# Patient Record
Sex: Male | Born: 1975 | Race: White | Hispanic: No | Marital: Married | Smoking: Never smoker
Health system: Southern US, Community
[De-identification: ages and names within clinical notes are randomized; demographics above are authoritative.]

## PROBLEM LIST (undated history)

## (undated) DIAGNOSIS — K760 Fatty (change of) liver, not elsewhere classified: Secondary | ICD-10-CM

## (undated) DIAGNOSIS — K859 Acute pancreatitis without necrosis or infection, unspecified: Secondary | ICD-10-CM

## (undated) DIAGNOSIS — E782 Mixed hyperlipidemia: Secondary | ICD-10-CM

## (undated) DIAGNOSIS — E781 Pure hyperglyceridemia: Secondary | ICD-10-CM

## (undated) DIAGNOSIS — R7301 Impaired fasting glucose: Secondary | ICD-10-CM

## (undated) DIAGNOSIS — Z8639 Personal history of other endocrine, nutritional and metabolic disease: Secondary | ICD-10-CM

## (undated) DIAGNOSIS — F17220 Nicotine dependence, chewing tobacco, uncomplicated: Secondary | ICD-10-CM

## (undated) DIAGNOSIS — F1010 Alcohol abuse, uncomplicated: Secondary | ICD-10-CM

## (undated) HISTORY — DX: Mixed hyperlipidemia: E78.2

## (undated) HISTORY — DX: Impaired fasting glucose: R73.01

## (undated) HISTORY — DX: Fatty (change of) liver, not elsewhere classified: K76.0

## (undated) HISTORY — DX: Nicotine dependence, chewing tobacco, uncomplicated: F17.220

## (undated) HISTORY — DX: Alcohol abuse, uncomplicated: F10.10

## (undated) HISTORY — DX: Personal history of other endocrine, nutritional and metabolic disease: Z86.39

## (undated) HISTORY — DX: Pure hyperglyceridemia: E78.1

## (undated) HISTORY — DX: Acute pancreatitis without necrosis or infection, unspecified: K85.90

---

## 2014-01-23 ENCOUNTER — Ambulatory Visit (INDEPENDENT_AMBULATORY_CARE_PROVIDER_SITE_OTHER): Payer: Managed Care, Other (non HMO)

## 2014-01-23 ENCOUNTER — Ambulatory Visit (INDEPENDENT_AMBULATORY_CARE_PROVIDER_SITE_OTHER): Payer: Managed Care, Other (non HMO) | Admitting: Emergency Medicine

## 2014-01-23 VITALS — BP 106/78 | HR 80 | Temp 99.6°F | Resp 20 | Ht 67.0 in | Wt 195.2 lb

## 2014-01-23 DIAGNOSIS — S86011A Strain of right Achilles tendon, initial encounter: Secondary | ICD-10-CM

## 2014-01-23 DIAGNOSIS — M25571 Pain in right ankle and joints of right foot: Secondary | ICD-10-CM

## 2014-01-23 MED ORDER — HYDROCODONE-ACETAMINOPHEN 5-325 MG PO TABS: 1.0000 | ORAL_TABLET | ORAL | Status: DC | PRN

## 2014-01-23 NOTE — Patient Instructions (Signed)
Complete Achilles Tendon Rupture An Achilles tendon rupture is an injury in which the tough, cord-like band that attaches the lower muscles of your leg to your heel (Achilles tendon) tears (ruptures). In a complete Achilles tendon rupture, you are not able to stand up on the toes of the injured leg. CAUSES A tendon may rupture if it is weakened or weakening (degenerative) and a sudden stress is applied to it. Weakening or degeneration of a tendon may be caused by:  Recurrent injuries, such as those causing Achilles tendinitis.  Damaged tendons.  Aging.  Vascular disease of the tendon. SIGNS AND SYMPTOMS  Feeling as if you were struck violently in the back of the ankle.  Hearing a "pop" and experiencing severe, sudden (acute) pain; however, the absence of pain does not mean there was not a rupture. DIAGNOSIS A physical exam is usually all that is needed to diagnose an Achilles tendon rupture. During the exam, your health care provider will touch the tendon and the structures around it. You may be asked to lie on your stomach or kneel on a chair while your health care provider squeezes your calf muscle. You most likely have a ruptured tendon if your foot does not flex.  Sometimes tests are performed. These may include:  An ultrasound. This allows quick confirmation of the diagnosis.  An X-Darreon.  An MRI. TREATMENT  A complete Achilles tendon rupture is treated with surgery. You will have a cast while the injury heals (this usually takes 6-10 weeks). Before your surgery, treatment may consist of:  Ice applied to the area.  Pain-relieving medicines.  Rest.  Crutches.  Keeping the ankle from moving (immobilization), usually with a splint. HOME CARE INSTRUCTIONS   Apply ice to the injured area:   Put ice in a plastic bag.   Place a towel between your skin and the bag.   Leave the ice on for 20 minutes, 2-3 times a day.   Use crutches and move about only as instructed.    Keep the leg elevated above the level of the heart (the center of the chest) at all times when not using the bathroom. Do not dangle the leg over a chair, couch, or bed. When lying down, elevate your leg on a few pillows. Elevation prevents swelling and reduces pain.  Avoid use of the injured area other than gentle range of motion of the toes.  Do not drive a car until your health care provider specifically tells you it is safe to do so.  Take all medicines as directed by your health care provider.  Keep all follow-up visits as directed by your health care provider. SEEK MEDICAL CARE IF:   Your pain and swelling increase, or your pain is not controlled by medicines.   You have new, unexplained symptoms, or your symptoms get worse.  You cannot move your toes or foot.  You develop warmth and swelling in your foot.  You have an unexplained fever. MAKE SURE YOU:   Understand these instructions.  Will watch your condition.  Will get help right away if you are not doing well or get worse. Document Released: 10/31/2004 Document Revised: 06/07/2013 Document Reviewed: 09/11/2012 [facility] Patient Information 2015 [place], [place]. This information is not intended to replace advice given to you by your health care provider. Make sure you discuss any questions you have with your health care provider.
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
## 2015-03-07 ENCOUNTER — Encounter (HOSPITAL_COMMUNITY): Payer: Self-pay | Admitting: Emergency Medicine

## 2015-03-07 ENCOUNTER — Inpatient Hospital Stay (HOSPITAL_COMMUNITY)
Admission: EM | Admit: 2015-03-07 | Discharge: 2015-03-12 | DRG: 440 | Disposition: A | Discharge status: Home / Self Care | Payer: 59 | Attending: Internal Medicine | Admitting: Internal Medicine

## 2015-03-07 DIAGNOSIS — E783 Hyperchylomicronemia: Secondary | ICD-10-CM

## 2015-03-07 DIAGNOSIS — E860 Dehydration: Secondary | ICD-10-CM | POA: Diagnosis present

## 2015-03-07 DIAGNOSIS — K3580 Unspecified acute appendicitis: Secondary | ICD-10-CM

## 2015-03-07 DIAGNOSIS — D72829 Elevated white blood cell count, unspecified: Secondary | ICD-10-CM | POA: Diagnosis present

## 2015-03-07 DIAGNOSIS — R1013 Epigastric pain: Secondary | ICD-10-CM | POA: Diagnosis not present

## 2015-03-07 DIAGNOSIS — K859 Acute pancreatitis without necrosis or infection, unspecified: Secondary | ICD-10-CM | POA: Diagnosis not present

## 2015-03-07 DIAGNOSIS — R740 Nonspecific elevation of levels of transaminase and lactic acid dehydrogenase [LDH]: Secondary | ICD-10-CM | POA: Diagnosis present

## 2015-03-07 DIAGNOSIS — R748 Abnormal levels of other serum enzymes: Secondary | ICD-10-CM | POA: Diagnosis present

## 2015-03-07 DIAGNOSIS — E876 Hypokalemia: Secondary | ICD-10-CM | POA: Diagnosis not present

## 2015-03-07 DIAGNOSIS — Z23 Encounter for immunization: Secondary | ICD-10-CM

## 2015-03-07 DIAGNOSIS — E7800 Pure hypercholesterolemia, unspecified: Secondary | ICD-10-CM | POA: Diagnosis present

## 2015-03-07 DIAGNOSIS — K5900 Constipation, unspecified: Secondary | ICD-10-CM | POA: Diagnosis present

## 2015-03-07 DIAGNOSIS — Z720 Tobacco use: Secondary | ICD-10-CM

## 2015-03-07 DIAGNOSIS — K760 Fatty (change of) liver, not elsewhere classified: Secondary | ICD-10-CM | POA: Diagnosis present

## 2015-03-07 DIAGNOSIS — D696 Thrombocytopenia, unspecified: Secondary | ICD-10-CM | POA: Diagnosis not present

## 2015-03-07 DIAGNOSIS — E781 Pure hyperglyceridemia: Secondary | ICD-10-CM

## 2015-03-07 DIAGNOSIS — F1020 Alcohol dependence, uncomplicated: Secondary | ICD-10-CM | POA: Diagnosis present

## 2015-03-07 DIAGNOSIS — E875 Hyperkalemia: Secondary | ICD-10-CM

## 2015-03-07 LAB — URINALYSIS, ROUTINE W REFLEX MICROSCOPIC
Bilirubin Urine: NEGATIVE
GLUCOSE, UA: NEGATIVE mg/dL
HGB URINE DIPSTICK: NEGATIVE
KETONES UR: NEGATIVE mg/dL
LEUKOCYTES UA: NEGATIVE
Nitrite: NEGATIVE
PH: 5.5 (ref 5.0–8.0)
Protein, ur: NEGATIVE mg/dL
Specific Gravity, Urine: 1.015 (ref 1.005–1.030)

## 2015-03-07 LAB — DIFFERENTIAL
BAND NEUTROPHILS: 0 %
BASOS PCT: 0 %
BLASTS: 0 %
Basophils Absolute: 0 10*3/uL (ref 0.0–0.1)
Eosinophils Absolute: 0.2 10*3/uL (ref 0.0–0.7)
Eosinophils Relative: 1 %
LYMPHS ABS: 3.1 10*3/uL (ref 0.7–4.0)
LYMPHS PCT: 19 %
METAMYELOCYTES PCT: 0 %
MONOS PCT: 5 %
Monocytes Absolute: 0.8 10*3/uL (ref 0.1–1.0)
Myelocytes: 0 %
NEUTROS ABS: 12.2 10*3/uL — AB (ref 1.7–7.7)
NEUTROS PCT: 74 %
OTHER: 0 %
Promyelocytes Absolute: 1 %
nRBC: 0 /100 WBC

## 2015-03-07 LAB — CBC
HEMATOCRIT: 40 % (ref 39.0–52.0)
Hemoglobin: 14.5 g/dL (ref 13.0–17.0)
MCH: 31.6 pg (ref 26.0–34.0)
MCHC: 36.3 g/dL — ABNORMAL HIGH (ref 30.0–36.0)
MCV: 87.1 fL (ref 78.0–100.0)
PLATELETS: 200 10*3/uL (ref 150–400)
RBC: 4.59 MIL/uL (ref 4.22–5.81)
RDW: 13.2 % (ref 11.5–15.5)
WBC: 16.3 10*3/uL — AB (ref 4.0–10.5)

## 2015-03-07 LAB — COMPREHENSIVE METABOLIC PANEL
ALBUMIN: 4.2 g/dL (ref 3.5–5.0)
ALK PHOS: 44 U/L (ref 38–126)
ALT: 71 U/L — AB (ref 17–63)
ANION GAP: 18 — AB (ref 5–15)
AST: 68 U/L — ABNORMAL HIGH (ref 15–41)
BILIRUBIN TOTAL: 2.4 mg/dL — AB (ref 0.3–1.2)
BUN: 14 mg/dL (ref 6–20)
CO2: 18 mmol/L — AB (ref 22–32)
CREATININE: 0.93 mg/dL (ref 0.61–1.24)
Calcium: 8.9 mg/dL (ref 8.9–10.3)
Chloride: 103 mmol/L (ref 101–111)
GFR calc Af Amer: >60 mL/min (ref >60)
GFR calc non Af Amer: >60 mL/min (ref >60)
Glucose, Bld: 98 mg/dL (ref 65–99)
Potassium: 6 mmol/L — ABNORMAL HIGH (ref 3.5–5.1)
SODIUM: 139 mmol/L (ref 135–145)
Total Protein: 6.5 g/dL (ref 6.5–8.1)

## 2015-03-07 LAB — I-STAT TROPONIN, ED: Troponin i, poc: 0 ng/mL (ref 0.00–0.08)

## 2015-03-07 LAB — LIPASE, BLOOD: Lipase: 86 U/L — ABNORMAL HIGH (ref 11–51)

## 2015-03-07 MED ORDER — OXYCODONE-ACETAMINOPHEN 5-325 MG PO TABS
ORAL_TABLET | ORAL | Status: AC
  Filled 2015-03-07: qty 1

## 2015-03-07 MED ORDER — ONDANSETRON HCL 4 MG/2ML IJ SOLN
4.0000 mg | Freq: Once | INTRAMUSCULAR | Status: AC
  Administered 2015-03-07: 4 mg via INTRAVENOUS
  Filled 2015-03-07: qty 2

## 2015-03-07 MED ORDER — OXYCODONE-ACETAMINOPHEN 5-325 MG PO TABS
1.0000 | ORAL_TABLET | Freq: Once | ORAL | Status: AC
  Administered 2015-03-07: 1 via ORAL

## 2015-03-07 MED ORDER — SODIUM CHLORIDE 0.9 % IV BOLUS (SEPSIS)
1000.0000 mL | Freq: Once | INTRAVENOUS | Status: AC
  Administered 2015-03-07: 1000 mL via INTRAVENOUS

## 2015-03-07 MED ORDER — MORPHINE SULFATE (PF) 4 MG/ML IV SOLN
4.0000 mg | Freq: Once | INTRAVENOUS | Status: AC
  Administered 2015-03-07: 4 mg via INTRAVENOUS
  Filled 2015-03-07: qty 1

## 2015-03-07 NOTE — ED Notes (Signed)
EKG repeated - reports worsening epigastric pain , no nausea or SOB .

## 2015-03-07 NOTE — ED Provider Notes (Signed)
By signing my name below, I, Soijett Blue, attest that this documentation has been prepared under the direction and in the presence of [employer], DO. Electronically Signed: Soijett Blue, ED Scribe. 03/08/2015. 12:26 AM.  TIME SEEN: 11:27 PM  CHIEF COMPLAINT: Abdominal pain  HPI: HPI Comments: Alex Stevens is a 40 y.o. male who presents to the Emergency Department complaining of progressively worsening epigastric abdominal pain onset 9 AM this morning. He reports that his abdominal pain began as a dull pain that progressed to a sharp pain. He notes that his abdominal pain is worsened with breathing and movement and that it radiates to his back. Pt denies any alleviating factors. He reports that he is having normal bowel movements with his last this morning. He states that he is having associated symptoms of nausea, SOB due to pain, and chills. He states that he has not tried any medications for the relief for his symptoms. He denies vomiting, diarrhea, CP, blood in stool, black tarry stools, and any other symptoms. Denies taking excessive amounts of NSAIDs. Denies abdominal surgeries or medical issues with gallbladder or pancreas. Denies allergies to any medications. Pt states that he is an occasional alcohol drinker. States it was recently his birthday and he did drink a large amount of alcohol.   ROS: See HPI Constitutional: no fever  Eyes: no drainage  ENT: no runny nose   Cardiovascular:  no chest pain  Resp: no SOB  GI: no vomiting GU: no dysuria Integumentary: no rash  Allergy: no hives  Musculoskeletal: no leg swelling  Neurological: no slurred speech ROS otherwise negative  PAST MEDICAL HISTORY/PAST SURGICAL HISTORY:  History reviewed. No pertinent past medical history.  MEDICATIONS:  Prior to Admission medications   Medication Sig Start Date End Date Taking? Authorizing Provider  HYDROcodone-acetaminophen (NORCO) 5-325 MG per tablet Take 1-2 tablets by mouth every 4 (four)  hours as needed. 01/23/14   Roselee Culver, MD    ALLERGIES:  No Known Allergies  SOCIAL HISTORY:  Social History  Substance Use Topics  . Smoking status: Never Smoker   . Smokeless tobacco: Current User    Types: Chew  . Alcohol Use: Yes    FAMILY HISTORY: Family History  Problem Relation Age of Onset  . Heart disease Paternal Grandmother     EXAM: BP 141/95 mmHg  Pulse 97  Temp(Src) 97.9 F (36.6 C) (Oral)  Resp 18  Ht 5\' 6"  (1.676 m)  Wt 195 lb (88.451 kg)  BMI 31.49 kg/m2  SpO2 96% CONSTITUTIONAL: Alert and oriented and responds appropriately to questions. Appears uncomfortable. well-nourished HEAD: Normocephalic EYES: Conjunctivae clear, PERRL ENT: normal nose; no rhinorrhea; moist mucous membranes; pharynx without lesions noted NECK: Supple, no meningismus, no LAD  CARD: RRR; S1 and S2 appreciated; no murmurs, no clicks, no rubs, no gallops RESP: Normal chest excursion without splinting or tachypnea; breath sounds clear and equal bilaterally; no wheezes, no rhonchi, no rales, no hypoxia or respiratory distress, speaking full sentences ABD/GI: Normal bowel sounds; non-distended; soft, moderately tender in the epigastric region with voluntary guarding. Negative Murphy's sign, non-tender at McBurney's point, no rebound, no peritoneal signs BACK:  The back appears normal and is non-tender to palpation, there is no CVA tenderness EXT: Normal ROM in all joints; non-tender to palpation; no edema; normal capillary refill; no cyanosis, no calf tenderness or swelling    SKIN: Normal color for age and race; warm NEURO: Moves all extremities equally, sensation to light touch intact diffusely, cranial  nerves II through XII intact PSYCH: The patient's mood and manner are appropriate. Grooming and personal hygiene are appropriate.  MEDICAL DECISION MAKING: Patient here with epigastric, right upper quadrant abdominal pain. Labs to show mild elevation of his lipase and some of  his liver function tests. We'll start with right upper quadrant ultrasound to evaluate for cholecystitis, cholelithiasis, choledocholithiasis. Differential also includes pancreatitis. We'll give IV fluids, pain medication.  He also has mild elevation of his potassium. Not on potassium supplements. No renal failure. We'll give IV fluids and Kayexalate. We'll repeat this potassium. No EKG changes.  ED PROGRESS: Shows normal gallbladder. Pancreas obscured by bowel gas. Will proceed with CT.  Repeat potassium shows 6.0. This was a redrawn before he was given IV fluids.   CT scan shows acute pancreatitis without necrosis or pseudocyst. Patient states he is still in significant pain despite morphine and Dilaudid. We'll continue IV hydration and admit.  3:11 AM- talked with Dr. Tamala Julian with Hospitalist Service who agrees on admission to Telemetry inpatient. We will recheck pt potassium level after 2 Liters of fluid and care transferred to hospitalist service. We'll keep him  NPO, continue pain medication and IV hydration. Pt and wife updated with plan. Admitted to telemetry inpatient. I placed holding orders per his request. Care transferred to hospitalist service.  ECG interpretation   Date: 03/07/2015, 19:48   Rate: 90  Rhythm: normal sinus rhythm  QRS Axis: normal  Intervals: normal  ST/T Wave abnormalities: normal  Conduction Disutrbances: none  Narrative Interpretation:   Old EKG Reviewed: No significant changes noted. No old tracing to compare.    EKG Interpretation  Date/Time:  Tuesday March 07 2015 21:08:55 EST Ventricular Rate:  100 PR Interval:  136 QRS Duration: 72 QT Interval:  318 QTC Calculation: 410 R Axis:   13 Text Interpretation:  Normal sinus rhythm Normal ECG No significant change since last tracing Confirmed by WARD,  DO, KRISTEN [phone]) on 03/07/2015 11:09:04 PM          I personally performed the services described in this documentation, which was scribed in  my presence. The recorded information has been reviewed and is accurate.   [place], DO 03/08/15 [phone]

## 2015-03-07 NOTE — ED Notes (Signed)
Pt. reports epigastric pain and mid back pain with nausea onset this morning , mild chills this evening , denies fever or diarrhea .

## 2015-03-08 ENCOUNTER — Encounter (HOSPITAL_COMMUNITY): Payer: Self-pay | Admitting: Radiology

## 2015-03-08 ENCOUNTER — Emergency Department (HOSPITAL_COMMUNITY): Payer: 59

## 2015-03-08 ENCOUNTER — Inpatient Hospital Stay (HOSPITAL_COMMUNITY): Payer: 59

## 2015-03-08 DIAGNOSIS — K5900 Constipation, unspecified: Secondary | ICD-10-CM | POA: Diagnosis present

## 2015-03-08 DIAGNOSIS — K859 Acute pancreatitis without necrosis or infection, unspecified: Secondary | ICD-10-CM | POA: Diagnosis present

## 2015-03-08 DIAGNOSIS — K858 Other acute pancreatitis without necrosis or infection: Secondary | ICD-10-CM | POA: Diagnosis not present

## 2015-03-08 DIAGNOSIS — E781 Pure hyperglyceridemia: Secondary | ICD-10-CM | POA: Diagnosis not present

## 2015-03-08 DIAGNOSIS — D72829 Elevated white blood cell count, unspecified: Secondary | ICD-10-CM | POA: Diagnosis not present

## 2015-03-08 DIAGNOSIS — R748 Abnormal levels of other serum enzymes: Secondary | ICD-10-CM | POA: Diagnosis not present

## 2015-03-08 DIAGNOSIS — E860 Dehydration: Secondary | ICD-10-CM | POA: Diagnosis present

## 2015-03-08 DIAGNOSIS — K760 Fatty (change of) liver, not elsewhere classified: Secondary | ICD-10-CM | POA: Diagnosis present

## 2015-03-08 DIAGNOSIS — E875 Hyperkalemia: Secondary | ICD-10-CM | POA: Diagnosis present

## 2015-03-08 DIAGNOSIS — D696 Thrombocytopenia, unspecified: Secondary | ICD-10-CM | POA: Diagnosis not present

## 2015-03-08 DIAGNOSIS — R740 Nonspecific elevation of levels of transaminase and lactic acid dehydrogenase [LDH]: Secondary | ICD-10-CM | POA: Diagnosis present

## 2015-03-08 DIAGNOSIS — R1013 Epigastric pain: Secondary | ICD-10-CM | POA: Diagnosis present

## 2015-03-08 DIAGNOSIS — K852 Alcohol induced acute pancreatitis without necrosis or infection: Secondary | ICD-10-CM | POA: Diagnosis not present

## 2015-03-08 DIAGNOSIS — F1020 Alcohol dependence, uncomplicated: Secondary | ICD-10-CM | POA: Diagnosis present

## 2015-03-08 DIAGNOSIS — K8500 Idiopathic acute pancreatitis without necrosis or infection: Secondary | ICD-10-CM

## 2015-03-08 DIAGNOSIS — Z23 Encounter for immunization: Secondary | ICD-10-CM | POA: Diagnosis not present

## 2015-03-08 DIAGNOSIS — Z720 Tobacco use: Secondary | ICD-10-CM | POA: Diagnosis not present

## 2015-03-08 DIAGNOSIS — E7800 Pure hypercholesterolemia, unspecified: Secondary | ICD-10-CM | POA: Diagnosis present

## 2015-03-08 DIAGNOSIS — E876 Hypokalemia: Secondary | ICD-10-CM | POA: Diagnosis not present

## 2015-03-08 LAB — LIPID PANEL
Cholesterol: 323 mg/dL — ABNORMAL HIGH (ref 0–200)
LDL CALC: UNDETERMINED mg/dL (ref 0–99)
TRIGLYCERIDES: 1830 mg/dL — AB (ref <150)
VLDL: UNDETERMINED mg/dL (ref 0–40)

## 2015-03-08 LAB — BASIC METABOLIC PANEL
Anion gap: 6 (ref 5–15)
BUN: 7 mg/dL (ref 6–20)
CHLORIDE: 102 mmol/L (ref 101–111)
CO2: 25 mmol/L (ref 22–32)
CREATININE: 0.81 mg/dL (ref 0.61–1.24)
Calcium: 7.5 mg/dL — ABNORMAL LOW (ref 8.9–10.3)
GFR calc Af Amer: >60 mL/min (ref >60)
GFR calc non Af Amer: >60 mL/min (ref >60)
Glucose, Bld: 130 mg/dL — ABNORMAL HIGH (ref 65–99)
Potassium: 6.6 mmol/L (ref 3.5–5.1)
SODIUM: 133 mmol/L — AB (ref 135–145)

## 2015-03-08 LAB — TROPONIN I

## 2015-03-08 LAB — POTASSIUM: POTASSIUM: 6 mmol/L — AB (ref 3.5–5.1)

## 2015-03-08 MED ORDER — SODIUM CHLORIDE 0.9% FLUSH
3.0000 mL | Freq: Two times a day (BID) | INTRAVENOUS | Status: DC
  Administered 2015-03-11: 3 mL via INTRAVENOUS

## 2015-03-08 MED ORDER — HYDROMORPHONE HCL 1 MG/ML IJ SOLN
0.5000 mg | Freq: Once | INTRAMUSCULAR | Status: AC
  Administered 2015-03-08: 0.5 mg via INTRAVENOUS
  Filled 2015-03-08: qty 1

## 2015-03-08 MED ORDER — ENOXAPARIN SODIUM 40 MG/0.4ML ~~LOC~~ SOLN
40.0000 mg | SUBCUTANEOUS | Status: DC
  Administered 2015-03-08 – 2015-03-09 (×2): 40 mg via SUBCUTANEOUS
  Filled 2015-03-08 (×3): qty 0.4

## 2015-03-08 MED ORDER — SODIUM CHLORIDE 0.9 % IV SOLN: INTRAVENOUS | Status: AC

## 2015-03-08 MED ORDER — VITAMIN B-1 100 MG PO TABS
100.0000 mg | ORAL_TABLET | Freq: Every day | ORAL | Status: DC
  Administered 2015-03-08 – 2015-03-11 (×3): 100 mg via ORAL
  Filled 2015-03-08 (×4): qty 1

## 2015-03-08 MED ORDER — LORAZEPAM 2 MG/ML IJ SOLN: 1.0000 mg | Freq: Four times a day (QID) | INTRAMUSCULAR | Status: AC | PRN

## 2015-03-08 MED ORDER — HYDROMORPHONE HCL 1 MG/ML IJ SOLN
1.0000 mg | Freq: Once | INTRAMUSCULAR | Status: AC
  Administered 2015-03-08: 1 mg via INTRAVENOUS
  Filled 2015-03-08: qty 1

## 2015-03-08 MED ORDER — IOHEXOL 300 MG/ML  SOLN
100.0000 mL | Freq: Once | INTRAMUSCULAR | Status: AC | PRN
  Administered 2015-03-08: 100 mL via INTRAVENOUS

## 2015-03-08 MED ORDER — THIAMINE HCL 100 MG/ML IJ SOLN
100.0000 mg | Freq: Every day | INTRAMUSCULAR | Status: DC
  Administered 2015-03-09: 100 mg via INTRAVENOUS
  Filled 2015-03-08: qty 2

## 2015-03-08 MED ORDER — SODIUM CHLORIDE 0.9 % IV SOLN
1.0000 g | Freq: Once | INTRAVENOUS | Status: AC
  Administered 2015-03-08: 1 g via INTRAVENOUS
  Filled 2015-03-08: qty 10

## 2015-03-08 MED ORDER — ONDANSETRON HCL 4 MG/2ML IJ SOLN
4.0000 mg | Freq: Four times a day (QID) | INTRAMUSCULAR | Status: DC | PRN
  Administered 2015-03-08: 4 mg via INTRAVENOUS
  Filled 2015-03-08 (×2): qty 2

## 2015-03-08 MED ORDER — ONDANSETRON HCL 4 MG PO TABS: 4.0000 mg | ORAL_TABLET | Freq: Four times a day (QID) | ORAL | Status: DC | PRN

## 2015-03-08 MED ORDER — SODIUM CHLORIDE 0.9 % IV SOLN
INTRAVENOUS | Status: DC
  Administered 2015-03-08: 11:00:00 via INTRAVENOUS
  Administered 2015-03-08: 1000 mL via INTRAVENOUS
  Administered 2015-03-08 – 2015-03-12 (×11): via INTRAVENOUS

## 2015-03-08 MED ORDER — LORAZEPAM 1 MG PO TABS
1.0000 mg | ORAL_TABLET | Freq: Four times a day (QID) | ORAL | Status: AC | PRN
  Administered 2015-03-09: 1 mg via ORAL
  Filled 2015-03-08: qty 1

## 2015-03-08 MED ORDER — INFLUENZA VAC SPLIT QUAD 0.5 ML IM SUSY
0.5000 mL | PREFILLED_SYRINGE | INTRAMUSCULAR | Status: AC
  Administered 2015-03-09: 0.5 mL via INTRAMUSCULAR
  Filled 2015-03-08: qty 0.5

## 2015-03-08 MED ORDER — ALBUTEROL SULFATE (2.5 MG/3ML) 0.083% IN NEBU: 2.5000 mg | INHALATION_SOLUTION | RESPIRATORY_TRACT | Status: DC | PRN

## 2015-03-08 MED ORDER — SODIUM POLYSTYRENE SULFONATE 15 GM/60ML PO SUSP
30.0000 g | Freq: Once | ORAL | Status: AC
  Administered 2015-03-08: 30 g via ORAL
  Filled 2015-03-08: qty 120

## 2015-03-08 MED ORDER — HYDROMORPHONE HCL 1 MG/ML IJ SOLN
1.0000 mg | INTRAMUSCULAR | Status: DC | PRN
  Administered 2015-03-08 – 2015-03-11 (×26): 1 mg via INTRAVENOUS
  Filled 2015-03-08 (×26): qty 1

## 2015-03-08 NOTE — H&P (Addendum)
[facility] History and Physical  Alex Stevens [id number] DOB: 1975-10-19 DOA: 03/07/2015  Referring physician: ED PCP: No primary care provider on file.   Chief Complaint: Epigastric pain  HPI:  Alex Stevens is a 40 year old male previously healthy; who presents for acute onset of epigastric pain starting around 9 am. Patient reports sharp stabbing pain that radiated to the back. Pain is worsened with taking deep inspirations and moving. He has not been able to try anything to relieve symptoms except for not moving. Patient denies having similar symptoms like this over the past. Associated symptoms include chills, nausea, and shortness of breath. Pain was a 10 out of 10 on presentation. Denies any fever or vomiting. He celebrated his birthday approximately 2 days ago ([birth year]) and reports drinking approximately 8 beers or so throughout the day. Normally he drinks a couple to no beers throughout the week.  Upon admission patient was evaluated and seen have abnormal lab work including lipase 86, AST 68,  ALT 68, Tbili 2.4, and potassium 6. CT scan was obtained and showed signs of pancreatitis with no signs of necrosis or fluid collection. Patient was given Dilaudid, 30 g of Kayexalate, and 2 L of IV fluids.  Review of Systems  Constitutional: Positive for chills and malaise/fatigue. Negative for fever and weight loss.       Decreased appetite  HENT: Negative for hearing loss and tinnitus.   Eyes: Negative for double vision and photophobia.  Respiratory: Positive for shortness of breath. Negative for cough and hemoptysis.   Cardiovascular: Negative for chest pain and palpitations.  Gastrointestinal: Positive for nausea and abdominal pain. Negative for vomiting.  Genitourinary: Negative for urgency and frequency.  Musculoskeletal: Positive for back pain. Negative for neck pain.  Skin: Negative for itching and rash.  Neurological: Negative for tremors and sensory change.  Endo/Heme/Allergies:  Negative for environmental allergies. Does not bruise/bleed easily.  Psychiatric/Behavioral: Negative for hallucinations and substance abuse.       History reviewed. No pertinent past medical history.   History reviewed. No pertinent past surgical history.    Social History:  reports that he has never smoked. His smokeless tobacco use includes Chew. He reports that he drinks alcohol. He reports that he does not use illicit drugs. Where does patient live--home   and with whom if at home? wife Can patient participate in ADLs? Yes  No Known Allergies  Family History  Problem Relation Age of Onset  . Heart disease Paternal Grandmother        Prior to Admission medications   Medication Sig Start Date End Date Taking? Authorizing Provider  HYDROcodone-acetaminophen (NORCO) 5-325 MG per tablet Take 1-2 tablets by mouth every 4 (four) hours as needed. Patient not taking: Reported on 03/07/2015 01/23/14   Roselee Culver, MD     Physical Exam: Filed Vitals:   03/08/15 0145 03/08/15 0228 03/08/15 0230 03/08/15 0245  BP: 131/85  135/79 134/86  Pulse: 94 86 87 91  Temp:      TempSrc:      Resp: 21 20 21 23   Height:      Weight:      SpO2: 94% 95%  96%    Constitutional: Vital signs reviewed. Patient is a mild distress and appears uncomfortable sitting in the hospital bed. Head: Normocephalic and atraumatic  Ear: TM normal bilaterally  Mouth: no erythema or exudates, MMM  Eyes: PERRL, EOMI, conjunctivae normal, No scleral icterus.  Neck: Supple, Trachea midline normal ROM, No JVD, mass, thyromegaly,  or carotid bruit present.  Cardiovascular: RRR, S1 normal, S2 normal, no MRG, pulses symmetric and intact bilaterally  Pulmonary/Chest: CTAB, no wheezes, rales, or rhonchi  Abdominal: Soft. Tenderness to palpation in the epigastric region GU: no CVA tenderness Musculoskeletal: No joint deformities, erythema, or stiffness, ROM full and no nontender Ext: no edema and no  cyanosis, pulses palpable bilaterally (DP and PT)  Hematology: no cervical, inginal, or axillary adenopathy.  Neurological: A&O x3, Strenght is normal and symmetric bilaterally, cranial nerve II-XII are grossly intact, no focal motor deficit, sensory intact to light touch bilaterally.  Skin: Warm, dry and intact. No rash, cyanosis, or clubbing.  Psychiatric: Normal mood and affect. speech and behavior is normal. Judgment and thought content normal. Cognition and memory are normal.      Data Review   Micro Results No results found for this or any previous visit (from the past 240 hour(s)).  Radiology Reports US Abdomen Complete  03/08/2015  CLINICAL DATA:  Right upper quadrant pain for 1 day.  Elevated LFTs. EXAM: ABDOMEN ULTRASOUND COMPLETE COMPARISON:  None. FINDINGS: Gallbladder: Physiologically distended. No gallstones or wall thickening visualized. No sonographic Murphy sign noted by sonographer. Common bile duct: Diameter: 3 mm, however difficult to visualize. Liver: Diffusely increased and heterogeneous in parenchymal echogenicity.No focal lesion identified, however limited penetration. Normal directional flow in the main portal vein. IVC: No abnormality visualized. Pancreas: Visualized portion unremarkable. Pancreatic duct is normal measuring 3 mm. Majority of the pancreas is obscured pre Spleen: Size and appearance within normal limits. Right Kidney: Length: 10.4 cm. Echogenicity within normal limits. No mass or hydronephrosis visualized. Left Kidney: Length: 10.8 cm. Echogenicity within normal limits. No mass or hydronephrosis visualized. Abdominal aorta: No aneurysm visualized. Only proximal aorta is visualized, mid, distal iliac bifurcation are obscured. Other findings: None.  No ascites. IMPRESSION: 1. Hepatic steatosis. 2. Normal sonographic appearance of gallbladder. 3. Midline structures including pancreas and abdominal aorta partially obscured. Electronically Signed   By: Jeb Levering M.D.   On: 03/08/2015 01:24   Ct Abdomen Pelvis W Contrast  03/08/2015  CLINICAL DATA:  Upper abdominal pain for 1 day with nausea EXAM: CT ABDOMEN AND PELVIS WITH CONTRAST TECHNIQUE: Multidetector CT imaging of the abdomen and pelvis was performed using the standard protocol following bolus administration of intravenous contrast. CONTRAST:  180mL OMNIPAQUE IOHEXOL 300 MG/ML  SOLN COMPARISON:  None. FINDINGS: Lower chest and abdominal wall:  No contributory findings. Hepatobiliary: Dense hepatic steatosis.No evidence of biliary obstruction or stone. Pancreas: Expanded tail the pancreas with surrounding fat edema. No fluid collection, necrosis, or vascular complication. Spleen: Unremarkable. Adrenals/Urinary Tract: Negative adrenals. No hydronephrosis or stone. Unremarkable bladder. Reproductive:No pathologic findings. Stomach/Bowel:  No obstruction. No appendicitis. Vascular/Lymphatic: No acute vascular abnormality. No mass or adenopathy. Peritoneal: No ascites or pneumoperitoneum. Musculoskeletal: No acute abnormalities. IMPRESSION: 1. Acute pancreatitis without necrosis or collection. 2. Marked hepatic steatosis. Electronically Signed   By: Monte Fantasia M.D.   On: 03/08/2015 02:32     CBC  Recent Labs Lab 03/07/15 1959  WBC 16.3*  HGB 14.5  HCT 40.0  PLT 200  MCV 87.1  MCH 31.6  MCHC 36.3*  RDW 13.2  LYMPHSABS 3.1  MONOABS 0.8  EOSABS 0.2  BASOSABS 0.0    Chemistries   Recent Labs Lab 03/07/15 1959 03/07/15 2322  NA 139  --   K 6.0* 6.0*  CL 103  --   CO2 18*  --   GLUCOSE 98  --   BUN  14  --   CREATININE 0.93  --   CALCIUM 8.9  --   AST 68*  --   ALT 71*  --   ALKPHOS 44  --   BILITOT 2.4*  --    ------------------------------------------------------------------------------------------------------------------ estimated creatinine clearance is 110.1 mL/min (by C-G formula based on Cr of  0.93). ------------------------------------------------------------------------------------------------------------------ No results for input(s): HGBA1C in the last 72 hours. ------------------------------------------------------------------------------------------------------------------ No results for input(s): CHOL, HDL, LDLCALC, TRIG, CHOLHDL, LDLDIRECT in the last 72 hours. ------------------------------------------------------------------------------------------------------------------ No results for input(s): TSH, T4TOTAL, T3FREE, THYROIDAB in the last 72 hours.  Invalid input(s): FREET3 ------------------------------------------------------------------------------------------------------------------ No results for input(s): VITAMINB12, FOLATE, FERRITIN, TIBC, IRON, RETICCTPCT in the last 72 hours.  Coagulation profile No results for input(s): INR, PROTIME in the last 168 hours.  No results for input(s): DDIMER in the last 72 hours.  Cardiac Enzymes No results for input(s): CKMB, TROPONINI, MYOGLOBIN in the last 168 hours.  Invalid input(s): CK ------------------------------------------------------------------------------------------------------------------ Invalid input(s): POCBNP   CBG: No results for input(s): GLUCAP in the last 168 hours.     EKG: Independently reviewed. Normal sinus rhythm.   Assessment/Plan  Pancreatitis: Acute. Patient notes classic epigastric abdominal pain radiating to the back.  Lab work revealed elevated lipase 86, AST 68,  ALT 68, and Tbili 2.4 on admission. CT scan shows acute signs of pancreatitis without signs of necrosis or fluid collection.  - Admit to a telemetry bed - IV fluids of normal saline at 125 ml/hour - Pain control with IV Dilaudid - CXR - Lipid panel  Elevated liver enzymes: as seen above.  Likely secondary to acute pancreatitis - Continue to monitor  Hyperkalemia: Acute. Potassium elevated at 6 and confirmed. No EKG  changes seen. Patient was given 2 L of normal saline fluid bolus along with 30 grams of Kayexalate - Recheck BMP  Leukocytosis: Acute. WBC elevated at 16.3. Suspect secondary to  pancreatitis low suspicion for infectious process  - continue to monitor  Alcohol use: Patient reports drinking occasionally and denies daily drinking.  - Continue to monitor   Code Status:   full Family Communication: bedside Disposition Plan: admit   Total time spent 55 minutes.Greater than 50% of this time was spent in counseling, explanation of diagnosis, planning of further management, and coordination of care  [facility] Pager [phone]  If 7PM-7AM, please contact night-coverage [email or web address] Password [email or web address] 03/08/2015, 3:12 AM

## 2015-03-08 NOTE — Progress Notes (Signed)
CRITICAL VALUE ALERT  Critical value received:  K 6.6 hemolyzed, lipenic  Date of notification:  03/08/2015  Time of notification:  1700  Critical value read back:Yes.    Nurse who received alert:  Carleene Cooper   MD notified (1st page):  Dr. Carles Collet  Time of first page:  [phone]   Responding MD:  Dr. Carles Collet  Time MD responded: [phone]

## 2015-03-08 NOTE — ED Notes (Signed)
Paged Dr. Carles Collet to [place], [place]

## 2015-03-08 NOTE — Progress Notes (Signed)
PROGRESS NOTE  Alex Stevens [id number] DOB: 1975-06-16 DOA: 03/07/2015 PCP: No primary care provider on file. Brief History 40 year old male with no significant past medical history presented with acute onset of epigastric abdominal pain in the morning of 03/07/2015. The patient states that he drank at least 8 beers for his birthday on 03/05/2015. On a routine week, the patient states that he usually drinks 3-4 beers. He denies any liquor alcohol or illegal drug use. He has some nausea without emesis, diarrhea, hematochezia, melena. Workup revealed AST 68, ALT 71,total bilirubin 2.4, lipase 8.6.  CT of the abdomen and pelvis revealed fatty liver and peripancreatic tail edema. The patient was treated with intravenous opioids and started on intravenous fluids.  Assessment/Plan: Acute pancreatitis -Suspect underlying hyperlipidemia/hypertriglyceridemia -03/08/2015 abdominal ultrasound--negative cholelithiasis; hepatic steatosis -CT abdomen and pelvis--Pancreatic tail edema without necrosis -Continue IV fluids -Continue IV opioids -Bowel rest -Check lipid panel -Urine drug screen -EKG shows sinus rhythm with nonspecific T-wave changes Transaminasemia -Likely secondary to fatty liver -Check viral hepatitis serology -Check HIV Hyperkalemia -Patient given Kayexalate -Repeat potassium Leukocytosis -Likely stress demargination -The patient is afebrile and hemodynamically stable -Urinalysis negative for pyuria -CXR negative for infiltrates Alcohol dependence -Alcohol withdrawal protocol   Family Communication:   Wife updated at beside Disposition Plan:   Home when medically stable      Procedures/Studies: US Abdomen Complete  03/08/2015  CLINICAL DATA:  Right upper quadrant pain for 1 day.  Elevated LFTs. EXAM: ABDOMEN ULTRASOUND COMPLETE COMPARISON:  None. FINDINGS: Gallbladder: Physiologically distended. No gallstones or wall thickening visualized. No sonographic Murphy  sign noted by sonographer. Common bile duct: Diameter: 3 mm, however difficult to visualize. Liver: Diffusely increased and heterogeneous in parenchymal echogenicity.No focal lesion identified, however limited penetration. Normal directional flow in the main portal vein. IVC: No abnormality visualized. Pancreas: Visualized portion unremarkable. Pancreatic duct is normal measuring 3 mm. Majority of the pancreas is obscured pre Spleen: Size and appearance within normal limits. Right Kidney: Length: 10.4 cm. Echogenicity within normal limits. No mass or hydronephrosis visualized. Left Kidney: Length: 10.8 cm. Echogenicity within normal limits. No mass or hydronephrosis visualized. Abdominal aorta: No aneurysm visualized. Only proximal aorta is visualized, mid, distal iliac bifurcation are obscured. Other findings: None.  No ascites. IMPRESSION: 1. Hepatic steatosis. 2. Normal sonographic appearance of gallbladder. 3. Midline structures including pancreas and abdominal aorta partially obscured. Electronically Signed   By: Jeb Levering M.D.   On: 03/08/2015 01:24   Ct Abdomen Pelvis W Contrast  03/08/2015  CLINICAL DATA:  Upper abdominal pain for 1 day with nausea EXAM: CT ABDOMEN AND PELVIS WITH CONTRAST TECHNIQUE: Multidetector CT imaging of the abdomen and pelvis was performed using the standard protocol following bolus administration of intravenous contrast. CONTRAST:  133mL OMNIPAQUE IOHEXOL 300 MG/ML  SOLN COMPARISON:  None. FINDINGS: Lower chest and abdominal wall:  No contributory findings. Hepatobiliary: Dense hepatic steatosis.No evidence of biliary obstruction or stone. Pancreas: Expanded tail the pancreas with surrounding fat edema. No fluid collection, necrosis, or vascular complication. Spleen: Unremarkable. Adrenals/Urinary Tract: Negative adrenals. No hydronephrosis or stone. Unremarkable bladder. Reproductive:No pathologic findings. Stomach/Bowel:  No obstruction. No appendicitis.  Vascular/Lymphatic: No acute vascular abnormality. No mass or adenopathy. Peritoneal: No ascites or pneumoperitoneum. Musculoskeletal: No acute abnormalities. IMPRESSION: 1. Acute pancreatitis without necrosis or collection. 2. Marked hepatic steatosis. Electronically Signed   By: Monte Fantasia M.D.   On: 03/08/2015 02:32   Dg Chest [facility] 1 [address]  03/08/2015  CLINICAL DATA:  Pancreatitis EXAM: PORTABLE CHEST 1 VIEW COMPARISON:  None. FINDINGS: Low volumes with reticular densities at the left base correlating with mild atelectasis on prior CT. There is no edema, consolidation, effusion, or pneumothorax. Normal heart size and mediastinal contours. IMPRESSION: Low volumes with mild atelectasis. Electronically Signed   By: Monte Fantasia M.D.   On: 03/08/2015 03:41         Subjective: Patient complains of epigastric abdominal pain. Also has some nausea. He denies any fevers, chills, chest pain, first breath, vomiting, diarrhea, nonacute, melena, dysuria, hematuria. No rashes.  Objective: Filed Vitals:   03/08/15 0945 03/08/15 1015 03/08/15 1100 03/08/15 1130  BP: 123/71 124/65 130/75 134/90  Pulse: 97 97 99 107  Temp:      TempSrc:      Resp: 20 20 16 15   Height:      Weight:      SpO2: 93% 91% 93% 94%    Intake/Output Summary (Last 24 hours) at 03/08/15 1203 Last data filed at 03/08/15 0517  Gross per 24 hour  Intake   2000 ml  Output    350 ml  Net   1650 ml   Weight change:  Exam:   General:  Pt is alert, follows commands appropriately, not in acute distress  HEENT: No icterus, No thrush, No neck mass, [place]/AT  Cardiovascular: RRR, S1/S2, no rubs, no gallops  Respiratory: Bibasilar crackles without wheeze  Abdomen: Soft/+BS, epigastric tenderness without any rebound non distended, no guarding  Extremities: No edema, No lymphangitis, No petechiae, No rashes, no synovitis  Data Reviewed: Basic Metabolic Panel:  Recent Labs Lab 03/07/15 1959 03/07/15 2322  NA 139   --   K 6.0* 6.0*  CL 103  --   CO2 18*  --   GLUCOSE 98  --   BUN 14  --   CREATININE 0.93  --   CALCIUM 8.9  --    Liver Function Tests:  Recent Labs Lab 03/07/15 1959  AST 68*  ALT 71*  ALKPHOS 44  BILITOT 2.4*  PROT 6.5  ALBUMIN 4.2    Recent Labs Lab 03/07/15 1959  LIPASE 86*   No results for input(s): AMMONIA in the last 168 hours. CBC:  Recent Labs Lab 03/07/15 1959  WBC 16.3*  NEUTROABS 12.2*  HGB 14.5  HCT 40.0  MCV 87.1  PLT 200   Cardiac Enzymes: No results for input(s): CKTOTAL, CKMB, CKMBINDEX, TROPONINI in the last 168 hours. BNP: Invalid input(s): POCBNP CBG: No results for input(s): GLUCAP in the last 168 hours.  No results found for this or any previous visit (from the past 240 hour(s)).   Scheduled Meds: . enoxaparin (LOVENOX) injection  40 mg Subcutaneous Q24H  . sodium chloride flush  3 mL Intravenous Q12H   Continuous Infusions: . sodium chloride 125 mL/hr at 03/08/15 1104  . sodium chloride Stopped (03/08/15 0440)     Franshesca Chipman, DO  [facility] Pager [phone]  If 7PM-7AM, please contact night-coverage [email or web address] Password [email or web address] 03/08/2015, 12:03 PM   LOS: 0 days

## 2015-03-08 NOTE — ED Notes (Signed)
Notified admitting MD that pt asking for pain medication more frequently than q2hr. MD will give one time dose of dilaudid and re-assess pt later.

## 2015-03-09 ENCOUNTER — Inpatient Hospital Stay (HOSPITAL_COMMUNITY): Payer: 59

## 2015-03-09 DIAGNOSIS — E781 Pure hyperglyceridemia: Secondary | ICD-10-CM

## 2015-03-09 DIAGNOSIS — K858 Other acute pancreatitis without necrosis or infection: Secondary | ICD-10-CM

## 2015-03-09 DIAGNOSIS — E783 Hyperchylomicronemia: Secondary | ICD-10-CM

## 2015-03-09 LAB — HIV ANTIBODY (ROUTINE TESTING W REFLEX): HIV SCREEN 4TH GENERATION: NONREACTIVE

## 2015-03-09 LAB — COMPREHENSIVE METABOLIC PANEL
ALBUMIN: 3.2 g/dL — AB (ref 3.5–5.0)
ALK PHOS: 34 U/L — AB (ref 38–126)
ALT: 31 U/L (ref 17–63)
AST: 23 U/L (ref 15–41)
Anion gap: 11 (ref 5–15)
BUN: 7 mg/dL (ref 6–20)
CALCIUM: 7.5 mg/dL — AB (ref 8.9–10.3)
CO2: 26 mmol/L (ref 22–32)
CREATININE: 1.15 mg/dL (ref 0.61–1.24)
Chloride: 103 mmol/L (ref 101–111)
GFR calc Af Amer: >60 mL/min (ref >60)
GFR calc non Af Amer: >60 mL/min (ref >60)
GLUCOSE: 117 mg/dL — AB (ref 65–99)
Potassium: 3.7 mmol/L (ref 3.5–5.1)
SODIUM: 140 mmol/L (ref 135–145)
Total Bilirubin: 1.2 mg/dL (ref 0.3–1.2)
Total Protein: 6.2 g/dL — ABNORMAL LOW (ref 6.5–8.1)

## 2015-03-09 LAB — HEPATITIS C ANTIBODY: HCV Ab: <0.1 s/co ratio (ref 0.0–0.9)

## 2015-03-09 LAB — RAPID URINE DRUG SCREEN, HOSP PERFORMED
AMPHETAMINES: NOT DETECTED
BARBITURATES: NOT DETECTED
Benzodiazepines: NOT DETECTED
Cocaine: NOT DETECTED
Opiates: POSITIVE — AB
TETRAHYDROCANNABINOL: NOT DETECTED

## 2015-03-09 LAB — CBC
HCT: 40.2 % (ref 39.0–52.0)
HEMOGLOBIN: 13.5 g/dL (ref 13.0–17.0)
MCH: 30.5 pg (ref 26.0–34.0)
MCHC: 33.6 g/dL (ref 30.0–36.0)
MCV: 91 fL (ref 78.0–100.0)
PLATELETS: 142 10*3/uL — AB (ref 150–400)
RBC: 4.42 MIL/uL (ref 4.22–5.81)
RDW: 14 % (ref 11.5–15.5)
WBC: 13.8 10*3/uL — ABNORMAL HIGH (ref 4.0–10.5)

## 2015-03-09 LAB — HEPATITIS B SURFACE ANTIGEN: HEP B S AG: NEGATIVE

## 2015-03-09 MED ORDER — ACD FORMULA A 0.73-2.45-2.2 GM/100ML VI SOLN
Status: AC
  Administered 2015-03-09: 500 mL via INTRAVENOUS
  Filled 2015-03-09: qty 500

## 2015-03-09 MED ORDER — HEPARIN SODIUM (PORCINE) 1000 UNIT/ML IJ SOLN: 1000.0000 [IU] | Freq: Once | INTRAMUSCULAR | Status: DC

## 2015-03-09 MED ORDER — CALCIUM CARBONATE ANTACID 500 MG PO CHEW
2.0000 | CHEWABLE_TABLET | ORAL | Status: AC
  Administered 2015-03-09 (×2): 400 mg via ORAL
  Filled 2015-03-09: qty 2

## 2015-03-09 MED ORDER — DIPHENHYDRAMINE HCL 25 MG PO CAPS: 25.0000 mg | ORAL_CAPSULE | Freq: Four times a day (QID) | ORAL | Status: DC | PRN

## 2015-03-09 MED ORDER — FENOFIBRATE 160 MG PO TABS
160.0000 mg | ORAL_TABLET | Freq: Every day | ORAL | Status: DC
  Administered 2015-03-09 – 2015-03-11 (×3): 160 mg via ORAL
  Filled 2015-03-09 (×3): qty 1

## 2015-03-09 MED ORDER — CALCIUM CARBONATE 1250 (500 CA) MG PO TABS
1.0000 | ORAL_TABLET | Freq: Two times a day (BID) | ORAL | Status: AC
Start: 2015-03-09 — End: 2015-03-10
  Administered 2015-03-09 – 2015-03-10 (×4): 500 mg via ORAL
  Filled 2015-03-09 (×4): qty 1

## 2015-03-09 MED ORDER — SODIUM CHLORIDE 0.9 % IV SOLN
Freq: Once | INTRAVENOUS | Status: AC
  Administered 2015-03-09: 10:00:00 via INTRAVENOUS_CENTRAL
  Filled 2015-03-09: qty 200

## 2015-03-09 MED ORDER — ACD FORMULA A 0.73-2.45-2.2 GM/100ML VI SOLN
500.0000 mL | Status: DC
  Administered 2015-03-09 – 2015-03-11 (×2): 500 mL via INTRAVENOUS
  Filled 2015-03-09: qty 500

## 2015-03-09 MED ORDER — ACETAMINOPHEN 325 MG PO TABS
650.0000 mg | ORAL_TABLET | ORAL | Status: DC | PRN
  Filled 2015-03-09 (×2): qty 2

## 2015-03-09 MED ORDER — IBUPROFEN 400 MG PO TABS
400.0000 mg | ORAL_TABLET | Freq: Once | ORAL | Status: AC
Start: 2015-03-09 — End: 2015-03-09
  Administered 2015-03-09: 400 mg via ORAL
  Filled 2015-03-09: qty 1

## 2015-03-09 MED ORDER — CALCIUM CARBONATE ANTACID 500 MG PO CHEW
CHEWABLE_TABLET | ORAL | Status: AC
  Administered 2015-03-10: 400 mg via ORAL
  Filled 2015-03-09: qty 4

## 2015-03-09 MED ORDER — CALCIUM CARBONATE ANTACID 500 MG PO CHEW
2.0000 | CHEWABLE_TABLET | ORAL | Status: AC
  Administered 2015-03-09 – 2015-03-11 (×3): 400 mg via ORAL
  Filled 2015-03-09: qty 2

## 2015-03-09 MED ORDER — OMEGA-3-ACID ETHYL ESTERS 1 G PO CAPS: 1.0000 g | ORAL_CAPSULE | Freq: Two times a day (BID) | ORAL | Status: DC

## 2015-03-09 MED ORDER — SODIUM CHLORIDE 0.9 % IV SOLN
INTRAVENOUS | Status: AC
  Administered 2015-03-09 – 2015-03-10 (×3): via INTRAVENOUS_CENTRAL
  Filled 2015-03-09 (×4): qty 200

## 2015-03-09 MED ORDER — LIDOCAINE HCL 1 % IJ SOLN
INTRAMUSCULAR | Status: AC
  Filled 2015-03-09: qty 20

## 2015-03-09 MED ORDER — SODIUM CHLORIDE 0.9 % IV SOLN: 4.0000 g | Freq: Once | INTRAVENOUS | Status: DC

## 2015-03-09 MED ORDER — ACETAMINOPHEN 325 MG PO TABS: 650.0000 mg | ORAL_TABLET | ORAL | Status: DC | PRN

## 2015-03-09 MED ORDER — CALCIUM GLUCONATE 10 % IV SOLN
1.0000 g | Freq: Once | INTRAVENOUS | Status: DC
  Filled 2015-03-09: qty 10

## 2015-03-09 MED ORDER — HEPARIN SODIUM (PORCINE) 1000 UNIT/ML IJ SOLN
INTRAMUSCULAR | Status: AC
  Filled 2015-03-09: qty 1

## 2015-03-09 MED ORDER — CALCIUM CARBONATE 1250 (500 CA) MG PO TABS: 1.0000 | ORAL_TABLET | Freq: Three times a day (TID) | ORAL | Status: DC

## 2015-03-09 MED ORDER — ACETAMINOPHEN 325 MG PO TABS
650.0000 mg | ORAL_TABLET | Freq: Four times a day (QID) | ORAL | Status: DC | PRN
  Administered 2015-03-09 – 2015-03-10 (×3): 650 mg via ORAL
  Filled 2015-03-09 (×2): qty 2

## 2015-03-09 MED ORDER — SODIUM CHLORIDE 0.9 % IV SOLN
4.0000 g | Freq: Once | INTRAVENOUS | Status: AC
  Administered 2015-03-09: 4 g via INTRAVENOUS
  Filled 2015-03-09 (×3): qty 40

## 2015-03-09 MED ORDER — ACD FORMULA A 0.73-2.45-2.2 GM/100ML VI SOLN
500.0000 mL | Status: DC
  Administered 2015-03-10 – 2015-03-11 (×2): 500 mL via INTRAVENOUS
  Filled 2015-03-09: qty 500

## 2015-03-09 NOTE — Procedures (Signed)
Interventional Radiology Procedure Note  Procedure:  Right IJ Trialysis catheter placement  Complications:  None  Estimated Blood Loss: < 10 mL  20 cm, 13 Fr Trialysis catheter placed via right IJ vein.  Tip in RA.  OK to use.  Venetia Night. Kathlene Cote, M.D Pager:  [phone]

## 2015-03-09 NOTE — Care Management (Signed)
Utilization review completed. Kammie Scioli, RN Case Manager [phone]. 

## 2015-03-09 NOTE — Progress Notes (Addendum)
PROGRESS NOTE  Alex Stevens [id number] DOB: 01-27-1976 DOA: 03/07/2015 PCP: No primary care provider on file.  Brief History 40 year old male with no significant past medical history presented with acute onset of epigastric abdominal pain in the morning of 03/07/2015. The patient states that he drank at least 8 beers for his birthday on 03/05/2015. On a routine week, the patient states that he usually drinks 3-4 beers. He denies any liquor alcohol or illegal drug use. He has some nausea without emesis, diarrhea, hematochezia, melena. Workup revealed AST 68, ALT 71,total bilirubin 2.4, lipase 8.6. CT of the abdomen and pelvis revealed fatty liver and peripancreatic tail edema. The patient was treated with intravenous opioids and started on intravenous fluids.  Assessment/Plan: Acute pancreatitis -Secondary to hypertriglyceridemia -03/08/2015 abdominal ultrasound--negative cholelithiasis; hepatic steatosis -CT abdomen and pelvis--Pancreatic tail edema without necrosis -Continue IV fluids -Continue IV opioids -Start clear liquids -Triglycerides 1830 -Urine drug screen--+opiates only -place temporary dialysis catheter for plasmapheresis -consulted nephrology to assist with plasmapheresis--spoke with Dr. Elmarie Shiley -start fenofibrate -start statin if LFTs normalized and when triglycerides improving -daily triglycerides Transaminasemia -improved -Check viral hepatitis serology--neg -Check HIV-neg Hyperkalemia -Patient given Kayexalate x 2 -Repeat potassium--improved Leukocytosis -Likely stress demargination -The patient is afebrile and hemodynamically stable -Urinalysis negative for pyuria -CXR negative for infiltrates Alcohol dependence -Alcohol withdrawal protocol   Family Communication: Wife updated at beside Disposition Plan: Home when medically stable    Procedures/Studies: US Abdomen Complete  03/08/2015  CLINICAL DATA:  Right upper quadrant pain for 1 day.   Elevated LFTs. EXAM: ABDOMEN ULTRASOUND COMPLETE COMPARISON:  None. FINDINGS: Gallbladder: Physiologically distended. No gallstones or wall thickening visualized. No sonographic Murphy sign noted by sonographer. Common bile duct: Diameter: 3 mm, however difficult to visualize. Liver: Diffusely increased and heterogeneous in parenchymal echogenicity.No focal lesion identified, however limited penetration. Normal directional flow in the main portal vein. IVC: No abnormality visualized. Pancreas: Visualized portion unremarkable. Pancreatic duct is normal measuring 3 mm. Majority of the pancreas is obscured pre Spleen: Size and appearance within normal limits. Right Kidney: Length: 10.4 cm. Echogenicity within normal limits. No mass or hydronephrosis visualized. Left Kidney: Length: 10.8 cm. Echogenicity within normal limits. No mass or hydronephrosis visualized. Abdominal aorta: No aneurysm visualized. Only proximal aorta is visualized, mid, distal iliac bifurcation are obscured. Other findings: None.  No ascites. IMPRESSION: 1. Hepatic steatosis. 2. Normal sonographic appearance of gallbladder. 3. Midline structures including pancreas and abdominal aorta partially obscured. Electronically Signed   By: Jeb Levering M.D.   On: 03/08/2015 01:24   Ct Abdomen Pelvis W Contrast  03/08/2015  CLINICAL DATA:  Upper abdominal pain for 1 day with nausea EXAM: CT ABDOMEN AND PELVIS WITH CONTRAST TECHNIQUE: Multidetector CT imaging of the abdomen and pelvis was performed using the standard protocol following bolus administration of intravenous contrast. CONTRAST:  144mL OMNIPAQUE IOHEXOL 300 MG/ML  SOLN COMPARISON:  None. FINDINGS: Lower chest and abdominal wall:  No contributory findings. Hepatobiliary: Dense hepatic steatosis.No evidence of biliary obstruction or stone. Pancreas: Expanded tail the pancreas with surrounding fat edema. No fluid collection, necrosis, or vascular complication. Spleen: Unremarkable.  Adrenals/Urinary Tract: Negative adrenals. No hydronephrosis or stone. Unremarkable bladder. Reproductive:No pathologic findings. Stomach/Bowel:  No obstruction. No appendicitis. Vascular/Lymphatic: No acute vascular abnormality. No mass or adenopathy. Peritoneal: No ascites or pneumoperitoneum. Musculoskeletal: No acute abnormalities. IMPRESSION: 1. Acute pancreatitis without necrosis or collection. 2. Marked hepatic steatosis. Electronically Signed   By: Monte Fantasia  M.D.   On: 03/08/2015 02:32   Dg Chest Port 1 View  03/08/2015  CLINICAL DATA:  Pancreatitis EXAM: PORTABLE CHEST 1 VIEW COMPARISON:  None. FINDINGS: Low volumes with reticular densities at the left base correlating with mild atelectasis on prior CT. There is no edema, consolidation, effusion, or pneumothorax. Normal heart size and mediastinal contours. IMPRESSION: Low volumes with mild atelectasis. Electronically Signed   By: Monte Fantasia M.D.   On: 03/08/2015 03:41         Subjective: Patient says abdominal pain is improving. Has some nausea without emesis. Denies any fevers, chills, chest pain, shortness breath,  vomiting, diarrhea, dysuria, hematuria. Denies any headache or neck pain.  Objective: Filed Vitals:   03/08/15 1600 03/08/15 1659 03/08/15 2309 03/09/15 0318  BP:  139/84 125/79 125/81  Pulse:  94 115 109  Temp:  99.7 F (37.6 C) 99.1 F (37.3 C) 99.9 F (37.7 C)  TempSrc:  Oral Oral Oral  Resp: 20 20 17 17   Height:      Weight:   92.67 kg (204 lb 4.8 oz)   SpO2:  96% 95% 90%    Intake/Output Summary (Last 24 hours) at 03/09/15 1018 Last data filed at 03/09/15 0600  Gross per 24 hour  Intake   1735 ml  Output     50 ml  Net   1685 ml   Weight change: 4.218 kg (9 lb 4.8 oz) Exam:   General:  Pt is alert, follows commands appropriately, not in acute distress  HEENT: No icterus, No thrush, No neck mass, [place]/AT  Cardiovascular: RRR, S1/S2, no rubs, no gallops  Respiratory: CTA bilaterally, no  wheezing, no crackles, no rhonchi  Abdomen: Soft/+BS, epigastric tenderness without any rebound, non distended, no guarding  Extremities: No edema, No lymphangitis, No petechiae, No rashes, no synovitis  Data Reviewed: Basic Metabolic Panel:  Recent Labs Lab 03/07/15 1959 03/07/15 2322 03/08/15 1527 03/09/15 0525  NA 139  --  133* 140  K 6.0* 6.0* 6.6* 3.7  CL 103  --  102 103  CO2 18*  --  25 26  GLUCOSE 98  --  130* 117*  BUN 14  --  7 7  CREATININE 0.93  --  0.81 1.15  CALCIUM 8.9  --  7.5* 7.5*   Liver Function Tests:  Recent Labs Lab 03/07/15 1959 03/09/15 0525  AST 68* 23  ALT 71* 31  ALKPHOS 44 34*  BILITOT 2.4* 1.2  PROT 6.5 6.2*  ALBUMIN 4.2 3.2*    Recent Labs Lab 03/07/15 1959  LIPASE 86*   No results for input(s): AMMONIA in the last 168 hours. CBC:  Recent Labs Lab 03/07/15 1959 03/09/15 0525  WBC 16.3* 13.8*  NEUTROABS 12.2*  --   HGB 14.5 13.5  HCT 40.0 40.2  MCV 87.1 91.0  PLT 200 142*   Cardiac Enzymes:  Recent Labs Lab 03/08/15 1527  TROPONINI <0.03   BNP: Invalid input(s): POCBNP CBG: No results for input(s): GLUCAP in the last 168 hours.  No results found for this or any previous visit (from the past 240 hour(s)).   Scheduled Meds: . enoxaparin (LOVENOX) injection  40 mg Subcutaneous Q24H  . sodium chloride flush  3 mL Intravenous Q12H  . thiamine  100 mg Oral Daily   Or  . thiamine  100 mg Intravenous Daily   Continuous Infusions: . sodium chloride 125 mL/hr at 03/09/15 0154     Requan Hardge, DO  [facility] Pager [phone]  If 7PM-7AM, please contact night-coverage [email or web address] Password [email or web address] 03/09/2015, 10:18 AM   LOS: 1 day

## 2015-03-09 NOTE — Consult Note (Signed)
[facility] Consult Note     Date: 03/09/2015                  Patient Name:  Alex Stevens  MRN: [id number]  DOB: [birth year]  Age / Sex: 40 y.o., male         PCP: No primary care provider on file.                 Service Requesting Consult: [facility]                  Reason for Consult: Hypertriglyceridemia induced pancreatitis, for plasmaphoresis            Chief Complaint: epigastric pain HPI:  [name] is a 40 y/o with a past medical history of hypercholesterolemia presenting with acute onset of sharp epigastric pain that radiates to the back on 03/07/15, approximately 2 days after drinking approximately 8 beers for his [birth year]. He notes that initially the pain was mild and epigastric in nature, he developed nausea, and then the pain intensified and radiated to the back. He has not vomited. No fevers or chills, Tmax 99 at home. He has not eaten since Tuesday afternoon when the pain became exponentially worse. He denies melena or hematochezia.   In the ED, the patient was noted to have lipase 86, AST 68, ALT 71, Tbili 2.4, and potassium 6. CT scan revealed signs of pancreatitis without necrosis or fluid collection. Hewas given Percocet, morphine, 30 g of Kayexalate, and a 2 L of IV fluids.  Currently the pain is improved from a 10/10 to a 7/10. No nausea. He is eager about the idea of drinking water. He did endorse some constipation previously that has now progressed to diarrhea since being given Kayexalate. He notes that several years ago he went to [place] and was diagnosed with elevated cholesterol and triglycerides (but notes it wasn't this high). At that time he was started on Lipitor but states after 6 months to a year it was discontinued his since lipid panel had improved.  He drinks around 1-2 beers per week per his report, no other alcohol.  No family history of early MI or significantly elevated triglycerides.    History  reviewed. No pertinent past medical history. besides hypercholesterolemia noted in HPI   History reviewed. No pertinent past surgical history.   Family History  Problem Relation Age of Onset  . Heart disease Paternal Grandmother    Social History:  reports that he has never smoked. His smokeless tobacco use includes Chew. He reports that he drinks alcohol. He reports that he does not use illicit drugs.  Allergies: No Known Allergies  Medications Prior to Admission  Medication Sig Dispense Refill  . HYDROcodone-acetaminophen (NORCO) 5-325 MG per tablet Take 1-2 tablets by mouth every 4 (four) hours as needed. (Patient not taking: Reported on 03/07/2015) 30 tablet 0    Results for orders placed or performed during the hospital encounter of 03/07/15 (from the past 48 hour(s))  Lipase, blood     Status: Abnormal   Collection Time: 03/07/15  7:59 PM  Result Value Ref Range   Lipase 86 (H) 11 - 51 U/L  Comprehensive metabolic panel     Status: Abnormal   Collection Time: 03/07/15  7:59 PM  Result Value Ref Range   Sodium 139 135 - 145 mmol/L   Potassium 6.0 (H) 3.5 - 5.1 mmol/L    Comment: SPECIMEN HEMOLYZED. HEMOLYSIS MAY AFFECT  INTEGRITY OF RESULTS.   Chloride 103 101 - 111 mmol/L   CO2 18 (L) 22 - 32 mmol/L   Glucose, Bld 98 65 - 99 mg/dL   BUN 14 6 - 20 mg/dL   Creatinine, Ser 0.93 0.61 - 1.24 mg/dL   Calcium 8.9 8.9 - 10.3 mg/dL   Total Protein 6.5 6.5 - 8.1 g/dL   Albumin 4.2 3.5 - 5.0 g/dL   AST 68 (H) 15 - 41 U/L   ALT 71 (H) 17 - 63 U/L   Alkaline Phosphatase 44 38 - 126 U/L   Total Bilirubin 2.4 (H) 0.3 - 1.2 mg/dL   GFR calc non Af Amer >60 >60 mL/min   GFR calc Af Amer >60 >60 mL/min    Comment: (NOTE) The eGFR has been calculated using the CKD EPI equation. This calculation has not been validated in all clinical situations. eGFR's persistently <60 mL/min signify possible Chronic Kidney Disease.    Anion gap 18 (H) 5 - 15  CBC     Status: Abnormal   Collection  Time: 03/07/15  7:59 PM  Result Value Ref Range   WBC 16.3 (H) 4.0 - 10.5 K/uL   RBC 4.59 4.22 - 5.81 MIL/uL   Hemoglobin 14.5 13.0 - 17.0 g/dL   HCT 40.0 39.0 - 52.0 %   MCV 87.1 78.0 - 100.0 fL   MCH 31.6 26.0 - 34.0 pg   MCHC 36.3 (H) 30.0 - 36.0 g/dL    Comment: CORRECTED FOR LIPEMIA   RDW 13.2 11.5 - 15.5 %   Platelets 200 150 - 400 K/uL  Differential     Status: Abnormal   Collection Time: 03/07/15  7:59 PM  Result Value Ref Range   Neutrophils Relative % 74 %   Lymphocytes Relative 19 %   Monocytes Relative 5 %   Eosinophils Relative 1 %   Basophils Relative 0 %   Band Neutrophils 0 %   Metamyelocytes Relative 0 %   Myelocytes 0 %   Promyelocytes Absolute 1 %   Blasts 0 %   nRBC 0 0 /100 WBC   Other 0 %   Neutro Abs 12.2 (H) 1.7 - 7.7 K/uL   Lymphs Abs 3.1 0.7 - 4.0 K/uL   Monocytes Absolute 0.8 0.1 - 1.0 K/uL   Eosinophils Absolute 0.2 0.0 - 0.7 K/uL   Basophils Absolute 0.0 0.0 - 0.1 K/uL   RBC Morphology POLYCHROMASIA PRESENT    WBC Morphology ATYPICAL LYMPHOCYTES   I-Stat Troponin, ED (not at [facility])     Status: None   Collection Time: 03/07/15  8:26 PM  Result Value Ref Range   Troponin i, poc 0.00 0.00 - 0.08 ng/mL   Comment 3            Comment: Due to the release kinetics of cTnI, a negative result within the first hours of the onset of symptoms does not rule out myocardial infarction with certainty. If myocardial infarction is still suspected, repeat the test at appropriate intervals.   Urinalysis, Routine w reflex microscopic (not at [facility])     Status: None   Collection Time: 03/07/15  8:31 PM  Result Value Ref Range   Color, Urine YELLOW YELLOW   APPearance CLEAR CLEAR   Specific Gravity, Urine 1.015 1.005 - 1.030   pH 5.5 5.0 - 8.0   Glucose, UA NEGATIVE NEGATIVE mg/dL   Hgb urine dipstick NEGATIVE NEGATIVE   Bilirubin Urine NEGATIVE NEGATIVE   Ketones, ur NEGATIVE NEGATIVE mg/dL  Protein, ur NEGATIVE NEGATIVE mg/dL   Nitrite NEGATIVE NEGATIVE    Leukocytes, UA NEGATIVE NEGATIVE    Comment: MICROSCOPIC NOT DONE ON URINES WITH NEGATIVE PROTEIN, BLOOD, LEUKOCYTES, NITRITE, OR GLUCOSE <1000 mg/dL.  Potassium     Status: Abnormal   Collection Time: 03/07/15 11:22 PM  Result Value Ref Range   Potassium 6.0 (H) 3.5 - 5.1 mmol/L    Comment: POST-ULTRACENTRIFUGATION LIPEMIC SPECIMEN SPECIMEN HEMOLYZED. HEMOLYSIS MAY AFFECT INTEGRITY OF RESULTS.   HIV antibody     Status: None   Collection Time: 03/08/15  3:27 PM  Result Value Ref Range   HIV Screen 4th Generation wRfx Non Reactive Non Reactive    Comment: (NOTE) Performed At: [facility] [place], [place] [id number] Lindon Romp MD XA:[id number]   Lipid panel     Status: Abnormal   Collection Time: 03/08/15  3:27 PM  Result Value Ref Range   Cholesterol 323 (H) 0 - 200 mg/dL   Triglycerides 1830 (H) <150 mg/dL    Comment: RESULTS CONFIRMED BY MANUAL DILUTION HEMOLYSIS AT THIS LEVEL MAY AFFECT RESULT LIPEMIC SPECIMEN    HDL NOT REPORTED DUE TO HIGH TRIGLYCERIDES >40 mg/dL   Total CHOL/HDL Ratio NOT REPORTED DUE TO HIGH TRIGLYCERIDES RATIO   VLDL UNABLE TO CALCULATE IF TRIGLYCERIDE OVER 400 mg/dL 0 - 40 mg/dL   LDL Cholesterol UNABLE TO CALCULATE IF TRIGLYCERIDE OVER 400 mg/dL 0 - 99 mg/dL  Hepatitis B surface antigen     Status: None   Collection Time: 03/08/15  3:27 PM  Result Value Ref Range   Hepatitis B Surface Ag Negative Negative    Comment: (NOTE) Performed At: [facility] [place], [place] [id number] Lindon Romp MD ZM:[id number]   Hepatitis C antibody     Status: None   Collection Time: 03/08/15  3:27 PM  Result Value Ref Range   HCV Ab <0.1 0.0 - 0.9 s/co ratio    Comment: (NOTE)                                  Negative:     < 0.8                             Indeterminate: 0.8 - 0.9                                  Positive:     > 0.9 The CDC recommends that a positive HCV antibody result be followed  up with a HCV Nucleic Acid Amplification test (035465). Performed At: [facility] [place], [place] [id number] Lindon Romp MD YF:[id number]   Troponin I     Status: None   Collection Time: 03/08/15  3:27 PM  Result Value Ref Range   Troponin I <0.03 <0.031 ng/mL    Comment:        NO INDICATION OF MYOCARDIAL INJURY.   Basic metabolic panel     Status: Abnormal   Collection Time: 03/08/15  3:27 PM  Result Value Ref Range   Sodium 133 (L) 135 - 145 mmol/L   Potassium 6.6 (HH) 3.5 - 5.1 mmol/L    Comment: HEMOLYSIS AT THIS LEVEL MAY AFFECT RESULT LIPEMIC SPECIMEN CRITICAL RESULT CALLED TO, READ BACK BY AND VERIFIED WITH: P  STRAMOSKI,RN [id number] 03/08/2015 WBOND    Chloride 102 101 - 111 mmol/L   CO2 25 22 - 32 mmol/L   Glucose, Bld 130 (H) 65 - 99 mg/dL   BUN 7 6 - 20 mg/dL   Creatinine, Ser 0.81 0.61 - 1.24 mg/dL   Calcium 7.5 (L) 8.9 - 10.3 mg/dL   GFR calc non Af Amer >60 >60 mL/min   GFR calc Af Amer >60 >60 mL/min    Comment: (NOTE) The eGFR has been calculated using the CKD EPI equation. This calculation has not been validated in all clinical situations. eGFR's persistently <60 mL/min signify possible Chronic Kidney Disease.    Anion gap 6 5 - 15  Urine rapid drug screen (hosp performed)     Status: Abnormal   Collection Time: 03/08/15 11:18 PM  Result Value Ref Range   Opiates POSITIVE (A) NONE DETECTED   Cocaine NONE DETECTED NONE DETECTED   Benzodiazepines NONE DETECTED NONE DETECTED   Amphetamines NONE DETECTED NONE DETECTED   Tetrahydrocannabinol NONE DETECTED NONE DETECTED   Barbiturates NONE DETECTED NONE DETECTED    Comment:        DRUG SCREEN FOR MEDICAL PURPOSES ONLY.  IF CONFIRMATION IS NEEDED FOR ANY PURPOSE, NOTIFY LAB WITHIN 5 DAYS.        LOWEST DETECTABLE LIMITS FOR URINE DRUG SCREEN Drug Class       Cutoff (ng/mL) Amphetamine      1000 Barbiturate      200 Benzodiazepine   951 Tricyclics       884 Opiates           300 Cocaine          300 THC              50   Comprehensive metabolic panel     Status: Abnormal   Collection Time: 03/09/15  5:25 AM  Result Value Ref Range   Sodium 140 135 - 145 mmol/L    Comment: POST-ULTRACENTRIFUGATION   Potassium 3.7 3.5 - 5.1 mmol/L    Comment: SLIGHT HEMOLYSIS   Chloride 103 101 - 111 mmol/L   CO2 26 22 - 32 mmol/L   Glucose, Bld 117 (H) 65 - 99 mg/dL   BUN 7 6 - 20 mg/dL   Creatinine, Ser 1.15 0.61 - 1.24 mg/dL   Calcium 7.5 (L) 8.9 - 10.3 mg/dL   Total Protein 6.2 (L) 6.5 - 8.1 g/dL   Albumin 3.2 (L) 3.5 - 5.0 g/dL   AST 23 15 - 41 U/L   ALT 31 17 - 63 U/L   Alkaline Phosphatase 34 (L) 38 - 126 U/L   Total Bilirubin 1.2 0.3 - 1.2 mg/dL   GFR calc non Af Amer >60 >60 mL/min   GFR calc Af Amer >60 >60 mL/min    Comment: (NOTE) The eGFR has been calculated using the CKD EPI equation. This calculation has not been validated in all clinical situations. eGFR's persistently <60 mL/min signify possible Chronic Kidney Disease.    Anion gap 11 5 - 15  CBC     Status: Abnormal   Collection Time: 03/09/15  5:25 AM  Result Value Ref Range   WBC 13.8 (H) 4.0 - 10.5 K/uL   RBC 4.42 4.22 - 5.81 MIL/uL   Hemoglobin 13.5 13.0 - 17.0 g/dL   HCT 40.2 39.0 - 52.0 %   MCV 91.0 78.0 - 100.0 fL   MCH 30.5 26.0 - 34.0 pg   MCHC 33.6 30.0 - 36.0  g/dL   RDW 14.0 11.5 - 15.5 %   Platelets 142 (L) 150 - 400 K/uL   US Abdomen Complete  03/08/2015  CLINICAL DATA:  Right upper quadrant pain for 1 day.  Elevated LFTs. EXAM: ABDOMEN ULTRASOUND COMPLETE COMPARISON:  None. FINDINGS: Gallbladder: Physiologically distended. No gallstones or wall thickening visualized. No sonographic Murphy sign noted by sonographer. Common bile duct: Diameter: 3 mm, however difficult to visualize. Liver: Diffusely increased and heterogeneous in parenchymal echogenicity.No focal lesion identified, however limited penetration. Normal directional flow in the main portal vein. IVC: No abnormality  visualized. Pancreas: Visualized portion unremarkable. Pancreatic duct is normal measuring 3 mm. Majority of the pancreas is obscured pre Spleen: Size and appearance within normal limits. Right Kidney: Length: 10.4 cm. Echogenicity within normal limits. No mass or hydronephrosis visualized. Left Kidney: Length: 10.8 cm. Echogenicity within normal limits. No mass or hydronephrosis visualized. Abdominal aorta: No aneurysm visualized. Only proximal aorta is visualized, mid, distal iliac bifurcation are obscured. Other findings: None.  No ascites. IMPRESSION: 1. Hepatic steatosis. 2. Normal sonographic appearance of gallbladder. 3. Midline structures including pancreas and abdominal aorta partially obscured. Electronically Signed   By: Jeb Levering M.D.   On: 03/08/2015 01:24   Ct Abdomen Pelvis W Contrast  03/08/2015  CLINICAL DATA:  Upper abdominal pain for 1 day with nausea EXAM: CT ABDOMEN AND PELVIS WITH CONTRAST TECHNIQUE: Multidetector CT imaging of the abdomen and pelvis was performed using the standard protocol following bolus administration of intravenous contrast. CONTRAST:  141m OMNIPAQUE IOHEXOL 300 MG/ML  SOLN COMPARISON:  None. FINDINGS: Lower chest and abdominal wall:  No contributory findings. Hepatobiliary: Dense hepatic steatosis.No evidence of biliary obstruction or stone. Pancreas: Expanded tail the pancreas with surrounding fat edema. No fluid collection, necrosis, or vascular complication. Spleen: Unremarkable. Adrenals/Urinary Tract: Negative adrenals. No hydronephrosis or stone. Unremarkable bladder. Reproductive:No pathologic findings. Stomach/Bowel:  No obstruction. No appendicitis. Vascular/Lymphatic: No acute vascular abnormality. No mass or adenopathy. Peritoneal: No ascites or pneumoperitoneum. Musculoskeletal: No acute abnormalities. IMPRESSION: 1. Acute pancreatitis without necrosis or collection. 2. Marked hepatic steatosis. Electronically Signed   By: JMonte FantasiaM.D.   On:  03/08/2015 02:32   Dg Chest Port 1 View  03/08/2015  CLINICAL DATA:  Pancreatitis EXAM: PORTABLE CHEST 1 VIEW COMPARISON:  None. FINDINGS: Low volumes with reticular densities at the left base correlating with mild atelectasis on prior CT. There is no edema, consolidation, effusion, or pneumothorax. Normal heart size and mediastinal contours. IMPRESSION: Low volumes with mild atelectasis. Electronically Signed   By: JMonte FantasiaM.D.   On: 03/08/2015 03:41    Review of Systems  Constitutional: Negative for fever, chills and diaphoresis.  Eyes: Negative for blurred vision.  Respiratory: Negative for cough.   Cardiovascular: Negative for chest pain and palpitations.  Gastrointestinal: Positive for abdominal pain and diarrhea. Negative for nausea, vomiting, blood in stool and melena.  Genitourinary: Negative for dysuria.  Musculoskeletal: Positive for back pain. Negative for myalgias.  Skin: Negative for rash.  Neurological: Negative for dizziness and headaches.  Endo/Heme/Allergies: Does not bruise/bleed easily.  Psychiatric/Behavioral: Negative for depression.    Blood pressure 125/81, pulse 109, temperature 99.9 F (37.7 C), temperature source Oral, resp. rate 17, height 5' 6"  (1.676 m), weight 204 lb 4.8 oz (92.67 kg), SpO2 90 %. Physical Exam  Constitutional: He is oriented to person, place, and time. He appears well-developed. No distress.  HENT:  Head: Normocephalic.  Mouth/Throat: Oropharynx is clear and moist.  Eyes: Conjunctivae are  normal. Right eye exhibits no discharge. Left eye exhibits no discharge. No scleral icterus.  Neck: Neck supple.  Cardiovascular: Regular rhythm.  Tachycardia present.  Exam reveals no gallop and no friction rub.   Murmur heard. Respiratory: Effort normal and breath sounds normal. No respiratory distress. He has no rales.  GI: Soft. Bowel sounds are normal. He exhibits no distension and no mass. There is tenderness. There is no rebound and no  guarding.  Tenderness to palpation most prominent in the epigastric region with some in the lower quadrants bilaterally.  Musculoskeletal: He exhibits no edema or tenderness.  Lymphadenopathy:    He has no cervical adenopathy.  Neurological: He is alert and oriented to person, place, and time.  Skin: Skin is warm and dry. No rash noted. He is not diaphoretic.      Lipase     Component Value Date/Time   LIPASE 86* 03/07/2015 1959   Lipid Panel     Component Value Date/Time   CHOL 323* 03/08/2015 1527   TRIG 1830* 03/08/2015 1527   HDL NOT REPORTED DUE TO HIGH TRIGLYCERIDES 03/08/2015 1527   CHOLHDL NOT REPORTED DUE TO HIGH TRIGLYCERIDES 03/08/2015 1527   VLDL UNABLE TO CALCULATE IF TRIGLYCERIDE OVER 400 mg/dL 03/08/2015 1527   LDLCALC UNABLE TO CALCULATE IF TRIGLYCERIDE OVER 400 mg/dL 03/08/2015 1527      Assessment/Plan 1. Pancreatitis: Most likely secondary to hypertriglyceridemia. Less likely alcohol induced given low consumption and normal AST/ALT now. Lipase approximately 1.5x the upper limit of normal. No family history of early onset hypertriglyceridemia, difficult to control cholesterol, or early MI. Given severe hypertriglyceridemia > 1060m/dL in the setting of pancreatitis and mild hypocalcemia, will proceed with a trial of plasmapheresis as it has been shown to be beneficial in these patients. IR consult in for placement of a temporary dialysis catheter. Will watch for worsening hypocalcemia in the setting of plasmapheresis with citrate use.   2. History of hypercholesterolemia: patient with a history of hypercholesterolemia previously on Lipitor. Consider re-starting this in addition to gemfibrozil for elevated triglycerides.   3. Hyperkalemia: Initial K was noted to be 6.0 in the ED and he received 30g Kayexylate. On repeat, K was 6.6 requiring an additional dose of Kayexylate. K currently within normal limits.   4. Hypocalcemia: Corrected calcium 8.3. Consider  repleting with oral elemental calcium, especially in the setting of future plasmapheresis.   5. Leukocytosis: Likely stress induced demargination and dehydration without signs of infection. Primary following.   CKathrine Cords MD CHopewellResident, PGY-2 03/09/2015, 10:03 AM

## 2015-03-10 DIAGNOSIS — D72829 Elevated white blood cell count, unspecified: Secondary | ICD-10-CM

## 2015-03-10 LAB — COMPREHENSIVE METABOLIC PANEL
ALBUMIN: 3.7 g/dL (ref 3.5–5.0)
ALT: 10 U/L — AB (ref 17–63)
AST: 17 U/L (ref 15–41)
Alkaline Phosphatase: 14 U/L — ABNORMAL LOW (ref 38–126)
Anion gap: 9 (ref 5–15)
BILIRUBIN TOTAL: 1.9 mg/dL — AB (ref 0.3–1.2)
BUN: 7 mg/dL (ref 6–20)
CHLORIDE: 108 mmol/L (ref 101–111)
CO2: 23 mmol/L (ref 22–32)
CREATININE: 0.96 mg/dL (ref 0.61–1.24)
Calcium: 7.5 mg/dL — ABNORMAL LOW (ref 8.9–10.3)
GFR calc Af Amer: >60 mL/min (ref >60)
GLUCOSE: 110 mg/dL — AB (ref 65–99)
POTASSIUM: 3.7 mmol/L (ref 3.5–5.1)
Sodium: 140 mmol/L (ref 135–145)
Total Protein: 4.7 g/dL — ABNORMAL LOW (ref 6.5–8.1)

## 2015-03-10 LAB — POCT I-STAT, CHEM 8
BUN: 7 mg/dL (ref 6–20)
CALCIUM ION: 1.07 mmol/L — AB (ref 1.12–1.23)
CHLORIDE: 101 mmol/L (ref 101–111)
Creatinine, Ser: 0.9 mg/dL (ref 0.61–1.24)
Glucose, Bld: 120 mg/dL — ABNORMAL HIGH (ref 65–99)
HEMATOCRIT: 37 % — AB (ref 39.0–52.0)
Hemoglobin: 12.6 g/dL — ABNORMAL LOW (ref 13.0–17.0)
POTASSIUM: 2.9 mmol/L — AB (ref 3.5–5.1)
SODIUM: 139 mmol/L (ref 135–145)
TCO2: 22 mmol/L (ref 0–100)

## 2015-03-10 LAB — CBC
HEMATOCRIT: 35.5 % — AB (ref 39.0–52.0)
Hemoglobin: 11.9 g/dL — ABNORMAL LOW (ref 13.0–17.0)
MCH: 30.4 pg (ref 26.0–34.0)
MCHC: 33.5 g/dL (ref 30.0–36.0)
MCV: 90.8 fL (ref 78.0–100.0)
PLATELETS: 110 10*3/uL — AB (ref 150–400)
RBC: 3.91 MIL/uL — ABNORMAL LOW (ref 4.22–5.81)
RDW: 14.2 % (ref 11.5–15.5)
WBC: 11.7 10*3/uL — ABNORMAL HIGH (ref 4.0–10.5)

## 2015-03-10 LAB — TRIGLYCERIDES: TRIGLYCERIDES: 327 mg/dL — AB (ref <150)

## 2015-03-10 MED ORDER — OXYCODONE HCL 5 MG PO TABS
5.0000 mg | ORAL_TABLET | ORAL | Status: DC | PRN
  Administered 2015-03-11: 5 mg via ORAL
  Filled 2015-03-10: qty 1

## 2015-03-10 MED ORDER — CALCIUM CARBONATE ANTACID 500 MG PO CHEW
CHEWABLE_TABLET | ORAL | Status: AC
  Filled 2015-03-10: qty 2

## 2015-03-10 MED ORDER — SODIUM CHLORIDE 0.9 % IV SOLN
INTRAVENOUS | Status: AC
  Filled 2015-03-10 (×6): qty 200

## 2015-03-10 MED ORDER — ACD FORMULA A 0.73-2.45-2.2 GM/100ML VI SOLN
Status: AC
  Filled 2015-03-10: qty 1000

## 2015-03-10 MED ORDER — SODIUM CHLORIDE 0.9 % IV SOLN
4.0000 g | Freq: Once | INTRAVENOUS | Status: AC
  Administered 2015-03-11: 4 g via INTRAVENOUS
  Filled 2015-03-10 (×2): qty 40

## 2015-03-10 MED ORDER — ALBUMIN HUMAN 25 % IV SOLN
INTRAVENOUS | Status: AC
  Administered 2015-03-11 (×4): via INTRAVENOUS_CENTRAL
  Filled 2015-03-10 (×4): qty 200

## 2015-03-10 MED ORDER — ACD FORMULA A 0.73-2.45-2.2 GM/100ML VI SOLN
Status: AC
  Filled 2015-03-10: qty 500

## 2015-03-10 MED ORDER — CALCIUM CARBONATE ANTACID 500 MG PO CHEW
2.0000 | CHEWABLE_TABLET | ORAL | Status: AC
  Administered 2015-03-11: 400 mg via ORAL

## 2015-03-10 MED ORDER — HEPARIN SODIUM (PORCINE) 1000 UNIT/ML IJ SOLN: 1000.0000 [IU] | Freq: Once | INTRAMUSCULAR | Status: DC

## 2015-03-10 MED ORDER — ACD FORMULA A 0.73-2.45-2.2 GM/100ML VI SOLN
Status: AC
  Administered 2015-03-10: 500 mL via INTRAVENOUS
  Filled 2015-03-10: qty 500

## 2015-03-10 MED ORDER — DIPHENHYDRAMINE HCL 25 MG PO CAPS: 25.0000 mg | ORAL_CAPSULE | Freq: Four times a day (QID) | ORAL | Status: DC | PRN

## 2015-03-10 MED ORDER — ACD FORMULA A 0.73-2.45-2.2 GM/100ML VI SOLN
500.0000 mL | Status: DC
  Filled 2015-03-10: qty 500

## 2015-03-10 MED ORDER — ACETAMINOPHEN 325 MG PO TABS
650.0000 mg | ORAL_TABLET | ORAL | Status: DC | PRN
  Administered 2015-03-10: 650 mg via ORAL
  Filled 2015-03-10: qty 2

## 2015-03-10 MED ORDER — POLYETHYLENE GLYCOL 3350 17 G PO PACK
17.0000 g | PACK | Freq: Every day | ORAL | Status: DC
  Filled 2015-03-10: qty 1

## 2015-03-10 NOTE — Progress Notes (Signed)
[facility] Progress Note    Assessment/ Plan:   1. Pancreatitis: Most likely secondary to hypertriglyceridemia. Less likely alcohol induced given low consumption and normal AST/ALT now. Lipase approximately 1.5x the upper limit of normal. No family history of early onset hypertriglyceridemia, difficult to control cholesterol, or early MI. Given severe hypertriglyceridemia > 1000mg /dL in the setting of pancreatitis and mild hypocalcemia, will proceed with a trial of plasmapheresis as it has been shown to be beneficial in these patients, he will most likely only require 1 or 2 treatments. Right IJ Trialysis catheter placed on 2/2. Will watch for worsening hypocalcemia in the setting of plasmapheresis with citrate use; received a dose of calcium gluconate and 500mg  elemental calcium BID currently. Triglyceride order was apparently accidentally canceled for this AM per lab, will add on to this AM's CMET sample.    2. History of hypercholesterolemia: patient with a history of hypercholesterolemia previously on Lipitor. Continue fenofibrate per primary.   3. Hyperkalemia: Initial K was noted to be 6.0 in the ED and he received 30g Kayexylate. On repeat, K was 6.6 requiring an additional dose of Kayexylate. K currently within normal limits; I suspect the second lab may have been lab error from hemolysis.   4. Hypocalcemia: Now not in the presence of hypoalbuminemia (Ca currently 7.5). Continue elemental calcium and continue to follow.   5. Leukocytosis: Improving. Likely stress induced demargination and dehydration without signs of infection. Primary following.   Subjective:   Patient doing ok. Notes significant pain in the epigastric region. No nausea or vomiting. Tolerating a clear diet.    Objective:   BP 122/71 mmHg  Pulse 95  Temp(Src) 98.2 F (36.8 C) (Oral)  Resp 20  Ht 5\' 6"  (1.676 m)  Wt 207 lb 1.6 oz (93.94 kg)  BMI 33.44 kg/m2  SpO2 95%  Intake/Output Summary (Last  24 hours) at 03/10/15 [id number] Last data filed at 03/09/15 1831  Gross per 24 hour  Intake 2404.58 ml  Output    250 ml  Net 2154.58 ml   Weight change: 2 lb 12.8 oz (1.27 kg)  Physical Exam: General: Lying in bed in NAD sleeping. Non-toxic ENTM: Moist mucous membranes. Oropharynx clear. Cardiovascular: Mildly tachycardic. Regular rhythm. No murmurs, rubs, or gallops noted. No pitting edema noted. Respiratory: No increased WOB. CTAB without wheezing, rhonchi, or crackles noted. Abdomen: +BS, soft, non-distended. Tenderness in epigastric region. No rebound or guarding.  MSK: Normal bulk and tone.  Skin: No rashes noted   Imaging: Ir Fluoro Guide Cv Line Right  03/09/2015  CLINICAL DATA:  Hypertriglyceridemia and acute pancreatitis. The patient requires a non tunneled catheter for emergent plasma exchange. EXAM: NON-TUNNELED CENTRAL VENOUS CATHETER PLACEMENT WITH ULTRASOUND AND FLUOROSCOPIC GUIDANCE FLUOROSCOPY TIME:  6 seconds. PROCEDURE: The procedure, risks, benefits, and alternatives were explained to the patient. Questions regarding the procedure were encouraged and answered. The patient understands and consents to the procedure. A time-out was performed prior to the procedure. The right neck and chest were prepped with chlorhexidine in a sterile fashion, and a sterile drape was applied covering the operative field. Maximum barrier sterile technique with sterile gowns and gloves were used for the procedure. Local anesthesia was provided with 1% lidocaine. After creating a small venotomy incision, a 19 gauge needle was advanced into the right internal jugular vein under direct, real-time ultrasound guidance. Ultrasound image documentation was performed. A guidewire was advanced through the needle. The venotomy was dilated. A 13 French, 20 cm length Trialysis catheter  was advanced over the wire. The catheter was aspirated, flushed with saline, and injected with appropriate volume heparin dwells. The  catheter exit site was secured with 0-Prolene retention sutures. COMPLICATIONS: None.  No pneumothorax. FINDINGS: After catheter placement, the tip lies in the right atrium. The catheter aspirates normally and is ready for immediate use. IMPRESSION: Placement of non-tunneled central venous catheter via the left internal jugular vein. The catheter tip lies in the right atrium. The catheter is ready for immediate use. Electronically Signed   By: Aletta Edouard M.D.   On: 03/09/2015 17:14   Ir US Guide Vasc Access Right  03/09/2015  CLINICAL DATA:  Hypertriglyceridemia and acute pancreatitis. The patient requires a non tunneled catheter for emergent plasma exchange. EXAM: NON-TUNNELED CENTRAL VENOUS CATHETER PLACEMENT WITH ULTRASOUND AND FLUOROSCOPIC GUIDANCE FLUOROSCOPY TIME:  6 seconds. PROCEDURE: The procedure, risks, benefits, and alternatives were explained to the patient. Questions regarding the procedure were encouraged and answered. The patient understands and consents to the procedure. A time-out was performed prior to the procedure. The right neck and chest were prepped with chlorhexidine in a sterile fashion, and a sterile drape was applied covering the operative field. Maximum barrier sterile technique with sterile gowns and gloves were used for the procedure. Local anesthesia was provided with 1% lidocaine. After creating a small venotomy incision, a 19 gauge needle was advanced into the right internal jugular vein under direct, real-time ultrasound guidance. Ultrasound image documentation was performed. A guidewire was advanced through the needle. The venotomy was dilated. A 13 French, 20 cm length Trialysis catheter was advanced over the wire. The catheter was aspirated, flushed with saline, and injected with appropriate volume heparin dwells. The catheter exit site was secured with 0-Prolene retention sutures. COMPLICATIONS: None.  No pneumothorax. FINDINGS: After catheter placement, the tip lies in  the right atrium. The catheter aspirates normally and is ready for immediate use. IMPRESSION: Placement of non-tunneled central venous catheter via the left internal jugular vein. The catheter tip lies in the right atrium. The catheter is ready for immediate use. Electronically Signed   By: Aletta Edouard M.D.   On: 03/09/2015 17:14    Labs: BMET  Recent Labs Lab 03/07/15 1959 03/07/15 2322 03/08/15 1527 03/09/15 0525 03/09/15 2312 03/10/15 0530  NA 139  --  133* 140 139 140  K 6.0* 6.0* 6.6* 3.7 2.9* 3.7  CL 103  --  102 103 101 108  CO2 18*  --  25 26  --  23  GLUCOSE 98  --  130* 117* 120* 110*  BUN 14  --  7 7 7 7   CREATININE 0.93  --  0.81 1.15 0.90 0.96  CALCIUM 8.9  --  7.5* 7.5*  --  7.5*   CBC  Recent Labs Lab 03/07/15 1959 03/09/15 0525 03/09/15 2312 03/10/15 0530  WBC 16.3* 13.8*  --  11.7*  NEUTROABS 12.2*  --   --   --   HGB 14.5 13.5 12.6* 11.9*  HCT 40.0 40.2 37.0* 35.5*  MCV 87.1 91.0  --  90.8  PLT 200 142*  --  PENDING    Medications:    . calcium carbonate  1 tablet Oral BID WC  . calcium gluconate  1 g Intravenous Once  . citrate dextrose      . enoxaparin (LOVENOX) injection  40 mg Subcutaneous Q24H  . fenofibrate  160 mg Oral Daily  . heparin  1,000 Units Intracatheter Once  . heparin  1,000 Units Intracatheter  Once  . sodium chloride flush  3 mL Intravenous Q12H  . thiamine  100 mg Oral Daily   Or  . thiamine  100 mg Intravenous Daily     Kathrine Cords, MD [place] Resident, PGY-2 03/10/2015, 7:12 AM

## 2015-03-10 NOTE — Progress Notes (Signed)
PROGRESS NOTE  Etai Lisenby [id number] DOB: Sep 04, 1975 DOA: 03/07/2015 PCP: No primary care provider on file. Brief History 40 year old male with no significant past medical history presented with acute onset of epigastric abdominal pain in the morning of 03/07/2015. The patient states that he drank at least 8 beers for his birthday on 03/05/2015. On a routine week, the patient states that he usually drinks 3-4 beers. He denies any liquor alcohol or illegal drug use. He has some nausea without emesis, diarrhea, hematochezia, melena. Workup revealed AST 68, ALT 71,total bilirubin 2.4, lipase 8.6. CT of the abdomen and pelvis revealed fatty liver and peripancreatic tail edema. The patient was treated with intravenous opioids and started on intravenous fluids.  Assessment/Plan: Acute pancreatitis -Secondary to hypertriglyceridemia -03/08/2015 abdominal ultrasound--negative cholelithiasis; hepatic steatosis -CT abdomen and pelvis--Pancreatic tail edema without necrosis -Continue IV fluids -Continue IV opioids -Start clear liquids--> advance to full liquid diet -Triglycerides 1830-->327 -Urine drug screen--+opiates only -place temporary dialysis catheter for plasmapheresis-->plan 1 more session today -consulted nephrology to assist with plasmapheresis -appreciate Dr. Posey Pronto -start fenofibrate -start statin if LFTs normalized and when triglycerides improving -daily triglycerides Transaminasemia -improved -Check viral hepatitis serology--neg -Check HIV-neg Hyperkalemia -Patient given Kayexalate x 2 -Repeat potassium--improved Leukocytosis -Likely stress demargination -The patient is afebrile and hemodynamically stable -Urinalysis negative for pyuria -CXR negative for infiltrates Alcohol dependence -Alcohol withdrawal protocol -no signs of withdraw Fever -Patient had temperature 101.55F on the evening of 03/09/2015 -Remains hemodynamically stable -Likely due to patient's  pancreatitis -Follow blood cultures -We will not start antibiotics at this time Hypocalcemia -Supplement -Check 25 vitamin D Thrombocytopenia -HIT panel -B12  Family Communication:pt alone Disposition Plan: Home 1-2days     Procedures/Studies: US Abdomen Complete  03/08/2015  CLINICAL DATA:  Right upper quadrant pain for 1 day.  Elevated LFTs. EXAM: ABDOMEN ULTRASOUND COMPLETE COMPARISON:  None. FINDINGS: Gallbladder: Physiologically distended. No gallstones or wall thickening visualized. No sonographic Murphy sign noted by sonographer. Common bile duct: Diameter: 3 mm, however difficult to visualize. Liver: Diffusely increased and heterogeneous in parenchymal echogenicity.No focal lesion identified, however limited penetration. Normal directional flow in the main portal vein. IVC: No abnormality visualized. Pancreas: Visualized portion unremarkable. Pancreatic duct is normal measuring 3 mm. Majority of the pancreas is obscured pre Spleen: Size and appearance within normal limits. Right Kidney: Length: 10.4 cm. Echogenicity within normal limits. No mass or hydronephrosis visualized. Left Kidney: Length: 10.8 cm. Echogenicity within normal limits. No mass or hydronephrosis visualized. Abdominal aorta: No aneurysm visualized. Only proximal aorta is visualized, mid, distal iliac bifurcation are obscured. Other findings: None.  No ascites. IMPRESSION: 1. Hepatic steatosis. 2. Normal sonographic appearance of gallbladder. 3. Midline structures including pancreas and abdominal aorta partially obscured. Electronically Signed   By: Jeb Levering M.D.   On: 03/08/2015 01:24   Ct Abdomen Pelvis W Contrast  03/08/2015  CLINICAL DATA:  Upper abdominal pain for 1 day with nausea EXAM: CT ABDOMEN AND PELVIS WITH CONTRAST TECHNIQUE: Multidetector CT imaging of the abdomen and pelvis was performed using the standard protocol following bolus administration of intravenous contrast. CONTRAST:  14mL OMNIPAQUE  IOHEXOL 300 MG/ML  SOLN COMPARISON:  None. FINDINGS: Lower chest and abdominal wall:  No contributory findings. Hepatobiliary: Dense hepatic steatosis.No evidence of biliary obstruction or stone. Pancreas: Expanded tail the pancreas with surrounding fat edema. No fluid collection, necrosis, or vascular complication. Spleen: Unremarkable. Adrenals/Urinary Tract: Negative adrenals. No hydronephrosis or stone. Unremarkable bladder. Reproductive:No pathologic  findings. Stomach/Bowel:  No obstruction. No appendicitis. Vascular/Lymphatic: No acute vascular abnormality. No mass or adenopathy. Peritoneal: No ascites or pneumoperitoneum. Musculoskeletal: No acute abnormalities. IMPRESSION: 1. Acute pancreatitis without necrosis or collection. 2. Marked hepatic steatosis. Electronically Signed   By: Monte Fantasia M.D.   On: 03/08/2015 02:32   Ir Fluoro Guide Cv Line Right  03/09/2015  CLINICAL DATA:  Hypertriglyceridemia and acute pancreatitis. The patient requires a non tunneled catheter for emergent plasma exchange. EXAM: NON-TUNNELED CENTRAL VENOUS CATHETER PLACEMENT WITH ULTRASOUND AND FLUOROSCOPIC GUIDANCE FLUOROSCOPY TIME:  6 seconds. PROCEDURE: The procedure, risks, benefits, and alternatives were explained to the patient. Questions regarding the procedure were encouraged and answered. The patient understands and consents to the procedure. A time-out was performed prior to the procedure. The right neck and chest were prepped with chlorhexidine in a sterile fashion, and a sterile drape was applied covering the operative field. Maximum barrier sterile technique with sterile gowns and gloves were used for the procedure. Local anesthesia was provided with 1% lidocaine. After creating a small venotomy incision, a 19 gauge needle was advanced into the right internal jugular vein under direct, real-time ultrasound guidance. Ultrasound image documentation was performed. A guidewire was advanced through the needle. The  venotomy was dilated. A 13 French, 20 cm length Trialysis catheter was advanced over the wire. The catheter was aspirated, flushed with saline, and injected with appropriate volume heparin dwells. The catheter exit site was secured with 0-Prolene retention sutures. COMPLICATIONS: None.  No pneumothorax. FINDINGS: After catheter placement, the tip lies in the right atrium. The catheter aspirates normally and is ready for immediate use. IMPRESSION: Placement of non-tunneled central venous catheter via the left internal jugular vein. The catheter tip lies in the right atrium. The catheter is ready for immediate use. Electronically Signed   By: Aletta Edouard M.D.   On: 03/09/2015 17:14   Ir US Guide Vasc Access Right  03/09/2015  CLINICAL DATA:  Hypertriglyceridemia and acute pancreatitis. The patient requires a non tunneled catheter for emergent plasma exchange. EXAM: NON-TUNNELED CENTRAL VENOUS CATHETER PLACEMENT WITH ULTRASOUND AND FLUOROSCOPIC GUIDANCE FLUOROSCOPY TIME:  6 seconds. PROCEDURE: The procedure, risks, benefits, and alternatives were explained to the patient. Questions regarding the procedure were encouraged and answered. The patient understands and consents to the procedure. A time-out was performed prior to the procedure. The right neck and chest were prepped with chlorhexidine in a sterile fashion, and a sterile drape was applied covering the operative field. Maximum barrier sterile technique with sterile gowns and gloves were used for the procedure. Local anesthesia was provided with 1% lidocaine. After creating a small venotomy incision, a 19 gauge needle was advanced into the right internal jugular vein under direct, real-time ultrasound guidance. Ultrasound image documentation was performed. A guidewire was advanced through the needle. The venotomy was dilated. A 13 French, 20 cm length Trialysis catheter was advanced over the wire. The catheter was aspirated, flushed with saline, and injected  with appropriate volume heparin dwells. The catheter exit site was secured with 0-Prolene retention sutures. COMPLICATIONS: None.  No pneumothorax. FINDINGS: After catheter placement, the tip lies in the right atrium. The catheter aspirates normally and is ready for immediate use. IMPRESSION: Placement of non-tunneled central venous catheter via the left internal jugular vein. The catheter tip lies in the right atrium. The catheter is ready for immediate use. Electronically Signed   By: Aletta Edouard M.D.   On: 03/09/2015 17:14   Dg Chest Port 1 View  03/08/2015  CLINICAL DATA:  Pancreatitis EXAM: PORTABLE CHEST 1 VIEW COMPARISON:  None. FINDINGS: Low volumes with reticular densities at the left base correlating with mild atelectasis on prior CT. There is no edema, consolidation, effusion, or pneumothorax. Normal heart size and mediastinal contours. IMPRESSION: Low volumes with mild atelectasis. Electronically Signed   By: Monte Fantasia M.D.   On: 03/08/2015 03:41         Subjective: Abdominal pain gradually improving. Tolerating clear liquids. Denies any fevers, chills, chest pain, short of breath, vomiting, diarrhea, dysuria, hematuria. He has some intermittent nausea.  Objective: Filed Vitals:   03/10/15 0236 03/10/15 0545 03/10/15 0824 03/10/15 0833  BP: 140/77 122/71  134/84  Pulse: 113 95  102  Temp: 98.4 F (36.9 C) 98.2 F (36.8 C)  98.1 F (36.7 C)  TempSrc: Oral Oral  Oral  Resp: 20 20  20   Height:      Weight: 93.94 kg (207 lb 1.6 oz)  93.895 kg (207 lb)   SpO2: 95% 95%  95%    Intake/Output Summary (Last 24 hours) at 03/10/15 1437 Last data filed at 03/10/15 1120  Gross per 24 hour  Intake 2906.66 ml  Output    200 ml  Net 2706.66 ml   Weight change: 1.27 kg (2 lb 12.8 oz) Exam:   General:  Pt is alert, follows commands appropriately, not in acute distress  HEENT: No icterus, No thrush, No neck mass, [place]/AT  Cardiovascular: RRR, S1/S2, no rubs, no  gallops  Respiratory: CTA bilaterally, no wheezing, no crackles, no rhonchi  Abdomen: Soft/+BS, epigastric and left lower quadrant pain without any rebound, non distended, no guarding; no hepatosplenomegaly  Extremities: No edema, No lymphangitis, No petechiae, No rashes, no synovitis  Data Reviewed: Basic Metabolic Panel:  Recent Labs Lab 03/07/15 1959 03/07/15 2322 03/08/15 1527 03/09/15 0525 03/09/15 2312 03/10/15 0530  NA 139  --  133* 140 139 140  K 6.0* 6.0* 6.6* 3.7 2.9* 3.7  CL 103  --  102 103 101 108  CO2 18*  --  25 26  --  23  GLUCOSE 98  --  130* 117* 120* 110*  BUN 14  --  7 7 7 7   CREATININE 0.93  --  0.81 1.15 0.90 0.96  CALCIUM 8.9  --  7.5* 7.5*  --  7.5*   Liver Function Tests:  Recent Labs Lab 03/07/15 1959 03/09/15 0525 03/10/15 0530  AST 68* 23 17  ALT 71* 31 10*  ALKPHOS 44 34* 14*  BILITOT 2.4* 1.2 1.9*  PROT 6.5 6.2* 4.7*  ALBUMIN 4.2 3.2* 3.7    Recent Labs Lab 03/07/15 1959  LIPASE 86*   No results for input(s): AMMONIA in the last 168 hours. CBC:  Recent Labs Lab 03/07/15 1959 03/09/15 0525 03/09/15 2312 03/10/15 0530  WBC 16.3* 13.8*  --  11.7*  NEUTROABS 12.2*  --   --   --   HGB 14.5 13.5 12.6* 11.9*  HCT 40.0 40.2 37.0* 35.5*  MCV 87.1 91.0  --  90.8  PLT 200 142*  --  110*   Cardiac Enzymes:  Recent Labs Lab 03/08/15 1527  TROPONINI <0.03   BNP: Invalid input(s): POCBNP CBG: No results for input(s): GLUCAP in the last 168 hours.  No results found for this or any previous visit (from the past 240 hour(s)).   Scheduled Meds: . calcium carbonate  1 tablet Oral BID WC  . calcium carbonate      . calcium gluconate  1 g Intravenous Once  . calcium gluconate IVPB  4 g Intravenous Once  . citrate dextrose      . enoxaparin (LOVENOX) injection  40 mg Subcutaneous Q24H  . fenofibrate  160 mg Oral Daily  . heparin  1,000 Units Intracatheter Once  . heparin  1,000 Units Intracatheter Once  . heparin  1,000  Units Intracatheter Once  . sodium chloride flush  3 mL Intravenous Q12H  . thiamine  100 mg Oral Daily   Or  . thiamine  100 mg Intravenous Daily   Continuous Infusions: . sodium chloride 125 mL/hr at 03/10/15 1120  . citrate dextrose    . citrate dextrose    . citrate dextrose       Sophiea Ueda, DO  [facility] Pager [phone]  If 7PM-7AM, please contact night-coverage [email or web address] Password [email or web address] 03/10/2015, 2:37 PM   LOS: 2 days

## 2015-03-11 LAB — POCT I-STAT, CHEM 8
BUN: 4 mg/dL — ABNORMAL LOW (ref 6–20)
CHLORIDE: 103 mmol/L (ref 101–111)
Calcium, Ion: 1.17 mmol/L (ref 1.12–1.23)
Creatinine, Ser: 0.6 mg/dL — ABNORMAL LOW (ref 0.61–1.24)
Glucose, Bld: 104 mg/dL — ABNORMAL HIGH (ref 65–99)
HEMATOCRIT: 36 % — AB (ref 39.0–52.0)
HEMOGLOBIN: 12.2 g/dL — AB (ref 13.0–17.0)
POTASSIUM: 3.1 mmol/L — AB (ref 3.5–5.1)
SODIUM: 139 mmol/L (ref 135–145)
TCO2: 20 mmol/L (ref 0–100)

## 2015-03-11 LAB — CBC
HCT: 35.2 % — ABNORMAL LOW (ref 39.0–52.0)
Hemoglobin: 11.8 g/dL — ABNORMAL LOW (ref 13.0–17.0)
MCH: 30.3 pg (ref 26.0–34.0)
MCHC: 33.5 g/dL (ref 30.0–36.0)
MCV: 90.3 fL (ref 78.0–100.0)
PLATELETS: 121 10*3/uL — AB (ref 150–400)
RBC: 3.9 MIL/uL — ABNORMAL LOW (ref 4.22–5.81)
RDW: 14.3 % (ref 11.5–15.5)
WBC: 13.6 10*3/uL — AB (ref 4.0–10.5)

## 2015-03-11 LAB — COMPREHENSIVE METABOLIC PANEL
ALT: 7 U/L — ABNORMAL LOW (ref 17–63)
ANION GAP: 13 (ref 5–15)
AST: 9 U/L — AB (ref 15–41)
Albumin: 4.3 g/dL (ref 3.5–5.0)
Alkaline Phosphatase: 8 U/L — ABNORMAL LOW (ref 38–126)
CHLORIDE: 110 mmol/L (ref 101–111)
CO2: 18 mmol/L — ABNORMAL LOW (ref 22–32)
Calcium: 8.1 mg/dL — ABNORMAL LOW (ref 8.9–10.3)
Creatinine, Ser: 0.78 mg/dL (ref 0.61–1.24)
Glucose, Bld: 120 mg/dL — ABNORMAL HIGH (ref 65–99)
POTASSIUM: 3.2 mmol/L — AB (ref 3.5–5.1)
Sodium: 141 mmol/L (ref 135–145)
TOTAL PROTEIN: 4.8 g/dL — AB (ref 6.5–8.1)
Total Bilirubin: 1.1 mg/dL (ref 0.3–1.2)

## 2015-03-11 LAB — VITAMIN D 25 HYDROXY (VIT D DEFICIENCY, FRACTURES): Vit D, 25-Hydroxy: 15.5 ng/mL — ABNORMAL LOW (ref 30.0–100.0)

## 2015-03-11 LAB — TRIGLYCERIDES: TRIGLYCERIDES: 155 mg/dL — AB (ref <150)

## 2015-03-11 LAB — HEPARIN INDUCED PLATELET AB (HIT ANTIBODY): Heparin Induced Plt Ab: 0.188 OD (ref 0.000–0.400)

## 2015-03-11 MED ORDER — CALCIUM CARBONATE ANTACID 500 MG PO CHEW
CHEWABLE_TABLET | ORAL | Status: AC
  Administered 2015-03-11: 400 mg via ORAL
  Filled 2015-03-11: qty 2

## 2015-03-11 MED ORDER — POTASSIUM CHLORIDE CRYS ER 20 MEQ PO TBCR
40.0000 meq | EXTENDED_RELEASE_TABLET | Freq: Once | ORAL | Status: AC
  Administered 2015-03-11: 40 meq via ORAL
  Filled 2015-03-11: qty 2

## 2015-03-11 MED ORDER — CALCIUM CARBONATE ANTACID 500 MG PO CHEW
CHEWABLE_TABLET | ORAL | Status: AC
Start: 2015-03-11 — End: 2015-03-11
  Administered 2015-03-11: 400 mg via ORAL
  Filled 2015-03-11: qty 2

## 2015-03-11 NOTE — Progress Notes (Addendum)
Alex Stevens Progress Note   Assessment/ Plan:   1. Acute Pancreatitis: Most likely secondary to hypertriglyceridemia-currently appears to be clinically improving and he is tolerating oral intake without much aggravation of epigastric pain. Recommend discontinuation of intravenous fluids today.  2. Severe hypertriglyceridemia: Status post plasma exchange/plasmapheresis for acute lowering of elevated triglycerides-satisfactory reduction noted on today's labs. Will have dialysis catheter removed and recommend continued lipid-lowering therapy/fibrates.  3. Hypokalemia: Likely from losses associated with plasmapheresis as well as limited oral intake-will replace with oral potassium supplement. 4. Hypocalcemia: Replaced via oral supplements anticipating losses with plasmapheresis-currently with acceptable corrected calcium level. 5. Leukocytosis: Improving. Likely stress induced demargination and dehydration without signs of infection. Primary following. No evidence of infection to prompt need for antibiotics.  Will sign off at this time and remain available for questions or concerns.  Subjective:   Reports to be feeling well-minimal epigastric pain, poor sleep last night because of delayed plasmapheresis time    Objective:   BP 129/79 mmHg  Pulse 93  Temp(Src) 99.4 F (37.4 C) (Oral)  Resp 16  Ht 5\' 6"  (1.676 m)  Wt 92.761 kg (204 lb 8 oz)  BMI 33.02 kg/m2  SpO2 97%  Intake/Output Summary (Last 24 hours) at 03/11/15 0840 Last data filed at 03/11/15 [id number]  Gross per 24 hour  Intake 5230.41 ml  Output    550 ml  Net 4680.41 ml   Weight change: -0.045 kg (-1.6 oz)  Physical Exam: Gen: Comfortably resting in bed, wife at bedside CVS: Pulse regular in rate and rhythm, S1 and S2 normal Resp: Clear to auscultation, no rales/rhonchi Abd: Soft, flat, mildly tender over epigastric area Ext: No lower extremity edema  Imaging: Ir Fluoro Guide Cv Line Right  03/09/2015   CLINICAL DATA:  Hypertriglyceridemia and acute pancreatitis. The patient requires a non tunneled catheter for emergent plasma exchange. EXAM: NON-TUNNELED CENTRAL VENOUS CATHETER PLACEMENT WITH ULTRASOUND AND FLUOROSCOPIC GUIDANCE FLUOROSCOPY TIME:  6 seconds. PROCEDURE: The procedure, risks, benefits, and alternatives were explained to the patient. Questions regarding the procedure were encouraged and answered. The patient understands and consents to the procedure. A time-out was performed prior to the procedure. The right neck and chest were prepped with chlorhexidine in a sterile fashion, and a sterile drape was applied covering the operative field. Maximum barrier sterile technique with sterile gowns and gloves were used for the procedure. Local anesthesia was provided with 1% lidocaine. After creating a small venotomy incision, a 19 gauge needle was advanced into the right internal jugular vein under direct, real-time ultrasound guidance. Ultrasound image documentation was performed. A guidewire was advanced through the needle. The venotomy was dilated. A 13 French, 20 cm length Trialysis catheter was advanced over the wire. The catheter was aspirated, flushed with saline, and injected with appropriate volume heparin dwells. The catheter exit site was secured with 0-Prolene retention sutures. COMPLICATIONS: None.  No pneumothorax. FINDINGS: After catheter placement, the tip lies in the right atrium. The catheter aspirates normally and is ready for immediate use. IMPRESSION: Placement of non-tunneled central venous catheter via the left internal jugular vein. The catheter tip lies in the right atrium. The catheter is ready for immediate use. Electronically Signed   By: Aletta Edouard M.D.   On: 03/09/2015 17:14   Ir US Guide Vasc Access Right  03/09/2015  CLINICAL DATA:  Hypertriglyceridemia and acute pancreatitis. The patient requires a non tunneled catheter for emergent plasma exchange. EXAM: NON-TUNNELED  CENTRAL VENOUS CATHETER PLACEMENT WITH ULTRASOUND AND  FLUOROSCOPIC GUIDANCE FLUOROSCOPY TIME:  6 seconds. PROCEDURE: The procedure, risks, benefits, and alternatives were explained to the patient. Questions regarding the procedure were encouraged and answered. The patient understands and consents to the procedure. A time-out was performed prior to the procedure. The right neck and chest were prepped with chlorhexidine in a sterile fashion, and a sterile drape was applied covering the operative field. Maximum barrier sterile technique with sterile gowns and gloves were used for the procedure. Local anesthesia was provided with 1% lidocaine. After creating a small venotomy incision, a 19 gauge needle was advanced into the right internal jugular vein under direct, real-time ultrasound guidance. Ultrasound image documentation was performed. A guidewire was advanced through the needle. The venotomy was dilated. A 13 French, 20 cm length Trialysis catheter was advanced over the wire. The catheter was aspirated, flushed with saline, and injected with appropriate volume heparin dwells. The catheter exit site was secured with 0-Prolene retention sutures. COMPLICATIONS: None.  No pneumothorax. FINDINGS: After catheter placement, the tip lies in the right atrium. The catheter aspirates normally and is ready for immediate use. IMPRESSION: Placement of non-tunneled central venous catheter via the left internal jugular vein. The catheter tip lies in the right atrium. The catheter is ready for immediate use. Electronically Signed   By: Aletta Edouard M.D.   On: 03/09/2015 17:14    Labs: BMET  Recent Labs Lab 03/07/15 1959 03/07/15 2322 03/08/15 1527 03/09/15 0525 03/09/15 2312 03/10/15 0530 03/11/15 0124 03/11/15 0438  NA 139  --  133* 140 139 140 139 141  K 6.0* 6.0* 6.6* 3.7 2.9* 3.7 3.1* 3.2*  CL 103  --  102 103 101 108 103 110  CO2 18*  --  25 26  --  23  --  18*  GLUCOSE 98  --  130* 117* 120* 110* 104*  120*  BUN 14  --  7 7 7 7  4* <5*  CREATININE 0.93  --  0.81 1.15 0.90 0.96 0.60* 0.78  CALCIUM 8.9  --  7.5* 7.5*  --  7.5*  --  8.1*   CBC  Recent Labs Lab 03/07/15 1959 03/09/15 0525 03/09/15 2312 03/10/15 0530 03/11/15 0124 03/11/15 0438  WBC 16.3* 13.8*  --  11.7*  --  13.6*  NEUTROABS 12.2*  --   --   --   --   --   HGB 14.5 13.5 12.6* 11.9* 12.2* 11.8*  HCT 40.0 40.2 37.0* 35.5* 36.0* 35.2*  MCV 87.1 91.0  --  90.8  --  90.3  PLT 200 142*  --  110*  --  121*    Medications:    . calcium gluconate  1 g Intravenous Once  . citrate dextrose      . fenofibrate  160 mg Oral Daily  . polyethylene glycol  17 g Oral Daily  . sodium chloride flush  3 mL Intravenous Q12H  . thiamine  100 mg Oral Daily   Or  . thiamine  100 mg Intravenous Daily   Elmarie Shiley, MD 03/11/2015, 8:40 AM

## 2015-03-11 NOTE — Progress Notes (Signed)
PROGRESS NOTE  Alex Stevens [id number] DOB: March 24, 1975 DOA: 03/07/2015 PCP: No primary care provider on file. Brief History 40 year old male with no significant past medical history presented with acute onset of epigastric abdominal pain in the morning of 03/07/2015. The patient states that he drank at least 8 beers for his birthday on 03/05/2015. On a routine week, the patient states that he usually drinks 3-4 beers. He denies any liquor alcohol or illegal drug use. He has some nausea without emesis, diarrhea, hematochezia, melena. Workup revealed AST 68, ALT 71,total bilirubin 2.4, lipase 8.6. CT of the abdomen and pelvis revealed fatty liver and peripancreatic tail edema. The patient was treated with intravenous opioids and started on intravenous fluids.  Assessment/Plan: Acute pancreatitis -Secondary to hypertriglyceridemia -03/08/2015 abdominal ultrasound--negative cholelithiasis; hepatic steatosis -CT abdomen and pelvis--Pancreatic tail edema without necrosis -Continue IV fluids -Continue IV opioids -Start clear liquids--> advance to full liquid diet-->advance to soft diet -Triglycerides 1830-->327-->155 -Urine drug screen--+opiates only -place temporary dialysis catheter for plasmapheresis--2 sessions -03/11/15--removed dialysis catheter -consulted nephrology to assist with plasmapheresis -appreciate Dr. Posey Pronto -started fenofibrate -start statin if LFTs normalized and when triglycerides improving -daily triglycerides Transaminasemia -improved -Check viral hepatitis serology--neg -Check HIV-neg Hyperkalemia -Patient given Kayexalate x 2 -Repeat potassium--improved Leukocytosis -Likely stress demargination -The patient is afebrile and hemodynamically stable -Urinalysis negative for pyuria -CXR negative for infiltrates Alcohol dependence -Alcohol withdrawal protocol -no signs of withdraw Fever -Patient had temperature 101.51F on the evening of 03/09/2015 -Remains  hemodynamically stable -Likely due to patient's pancreatitis -Follow blood cultures -We will not start antibiotics at this time Hypocalcemia -Supplement -Check 25 vitamin D--15.5 Thrombocytopenia -HITantibody--neg -B12 -SCDs -d/c lovenox  Family Communication:wife and mother updated at bedside Disposition Plan: Home 03/12/15 if stable    Procedures/Studies: US Abdomen Complete  03/08/2015  CLINICAL DATA:  Right upper quadrant pain for 1 day.  Elevated LFTs. EXAM: ABDOMEN ULTRASOUND COMPLETE COMPARISON:  None. FINDINGS: Gallbladder: Physiologically distended. No gallstones or wall thickening visualized. No sonographic Murphy sign noted by sonographer. Common bile duct: Diameter: 3 mm, however difficult to visualize. Liver: Diffusely increased and heterogeneous in parenchymal echogenicity.No focal lesion identified, however limited penetration. Normal directional flow in the main portal vein. IVC: No abnormality visualized. Pancreas: Visualized portion unremarkable. Pancreatic duct is normal measuring 3 mm. Majority of the pancreas is obscured pre Spleen: Size and appearance within normal limits. Right Kidney: Length: 10.4 cm. Echogenicity within normal limits. No mass or hydronephrosis visualized. Left Kidney: Length: 10.8 cm. Echogenicity within normal limits. No mass or hydronephrosis visualized. Abdominal aorta: No aneurysm visualized. Only proximal aorta is visualized, mid, distal iliac bifurcation are obscured. Other findings: None.  No ascites. IMPRESSION: 1. Hepatic steatosis. 2. Normal sonographic appearance of gallbladder. 3. Midline structures including pancreas and abdominal aorta partially obscured. Electronically Signed   By: Jeb Levering M.D.   On: 03/08/2015 01:24   Ct Abdomen Pelvis W Contrast  03/08/2015  CLINICAL DATA:  Upper abdominal pain for 1 day with nausea EXAM: CT ABDOMEN AND PELVIS WITH CONTRAST TECHNIQUE: Multidetector CT imaging of the abdomen and pelvis was  performed using the standard protocol following bolus administration of intravenous contrast. CONTRAST:  161mL OMNIPAQUE IOHEXOL 300 MG/ML  SOLN COMPARISON:  None. FINDINGS: Lower chest and abdominal wall:  No contributory findings. Hepatobiliary: Dense hepatic steatosis.No evidence of biliary obstruction or stone. Pancreas: Expanded tail the pancreas with surrounding fat edema. No fluid collection, necrosis, or vascular complication. Spleen: Unremarkable. Adrenals/Urinary Tract:  Negative adrenals. No hydronephrosis or stone. Unremarkable bladder. Reproductive:No pathologic findings. Stomach/Bowel:  No obstruction. No appendicitis. Vascular/Lymphatic: No acute vascular abnormality. No mass or adenopathy. Peritoneal: No ascites or pneumoperitoneum. Musculoskeletal: No acute abnormalities. IMPRESSION: 1. Acute pancreatitis without necrosis or collection. 2. Marked hepatic steatosis. Electronically Signed   By: Monte Fantasia M.D.   On: 03/08/2015 02:32   Ir Fluoro Guide Cv Line Right  03/09/2015  CLINICAL DATA:  Hypertriglyceridemia and acute pancreatitis. The patient requires a non tunneled catheter for emergent plasma exchange. EXAM: NON-TUNNELED CENTRAL VENOUS CATHETER PLACEMENT WITH ULTRASOUND AND FLUOROSCOPIC GUIDANCE FLUOROSCOPY TIME:  6 seconds. PROCEDURE: The procedure, risks, benefits, and alternatives were explained to the patient. Questions regarding the procedure were encouraged and answered. The patient understands and consents to the procedure. A time-out was performed prior to the procedure. The right neck and chest were prepped with chlorhexidine in a sterile fashion, and a sterile drape was applied covering the operative field. Maximum barrier sterile technique with sterile gowns and gloves were used for the procedure. Local anesthesia was provided with 1% lidocaine. After creating a small venotomy incision, a 19 gauge needle was advanced into the right internal jugular vein under direct, real-time  ultrasound guidance. Ultrasound image documentation was performed. A guidewire was advanced through the needle. The venotomy was dilated. A 13 French, 20 cm length Trialysis catheter was advanced over the wire. The catheter was aspirated, flushed with saline, and injected with appropriate volume heparin dwells. The catheter exit site was secured with 0-Prolene retention sutures. COMPLICATIONS: None.  No pneumothorax. FINDINGS: After catheter placement, the tip lies in the right atrium. The catheter aspirates normally and is ready for immediate use. IMPRESSION: Placement of non-tunneled central venous catheter via the left internal jugular vein. The catheter tip lies in the right atrium. The catheter is ready for immediate use. Electronically Signed   By: Aletta Edouard M.D.   On: 03/09/2015 17:14   Ir US Guide Vasc Access Right  03/09/2015  CLINICAL DATA:  Hypertriglyceridemia and acute pancreatitis. The patient requires a non tunneled catheter for emergent plasma exchange. EXAM: NON-TUNNELED CENTRAL VENOUS CATHETER PLACEMENT WITH ULTRASOUND AND FLUOROSCOPIC GUIDANCE FLUOROSCOPY TIME:  6 seconds. PROCEDURE: The procedure, risks, benefits, and alternatives were explained to the patient. Questions regarding the procedure were encouraged and answered. The patient understands and consents to the procedure. A time-out was performed prior to the procedure. The right neck and chest were prepped with chlorhexidine in a sterile fashion, and a sterile drape was applied covering the operative field. Maximum barrier sterile technique with sterile gowns and gloves were used for the procedure. Local anesthesia was provided with 1% lidocaine. After creating a small venotomy incision, a 19 gauge needle was advanced into the right internal jugular vein under direct, real-time ultrasound guidance. Ultrasound image documentation was performed. A guidewire was advanced through the needle. The venotomy was dilated. A 13 French, 20 cm  length Trialysis catheter was advanced over the wire. The catheter was aspirated, flushed with saline, and injected with appropriate volume heparin dwells. The catheter exit site was secured with 0-Prolene retention sutures. COMPLICATIONS: None.  No pneumothorax. FINDINGS: After catheter placement, the tip lies in the right atrium. The catheter aspirates normally and is ready for immediate use. IMPRESSION: Placement of non-tunneled central venous catheter via the left internal jugular vein. The catheter tip lies in the right atrium. The catheter is ready for immediate use. Electronically Signed   By: Aletta Edouard M.D.   On: 03/09/2015  17:14   Dg Chest Port 1 View  03/08/2015  CLINICAL DATA:  Pancreatitis EXAM: PORTABLE CHEST 1 VIEW COMPARISON:  None. FINDINGS: Low volumes with reticular densities at the left base correlating with mild atelectasis on prior CT. There is no edema, consolidation, effusion, or pneumothorax. Normal heart size and mediastinal contours. IMPRESSION: Low volumes with mild atelectasis. Electronically Signed   By: Monte Fantasia M.D.   On: 03/08/2015 03:41         Subjective: Pain in the abdomen continues to improve. Denies any fevers, chills, chest pain, shortness breath, nausea, vomiting, dysuria or hematuria. Had a bowel movement today.  Objective: Filed Vitals:   03/11/15 [id number] 03/11/15 0422 03/11/15 0917 03/11/15 1706  BP: 136/76 129/79 122/75 123/72  Pulse: 94 93 93 89  Temp: 97.7 F (36.5 C) 99.4 F (37.4 C) 98.1 F (36.7 C) 98.7 F (37.1 C)  TempSrc: Oral Oral Oral Oral  Resp: 17 16 18 18   Height:      Weight:      SpO2: 100% 97% 98% 100%    Intake/Output Summary (Last 24 hours) at 03/11/15 1845 Last data filed at 03/11/15 1800  Gross per 24 hour  Intake 4433.33 ml  Output    350 ml  Net 4083.33 ml   Weight change: -0.045 kg (-1.6 oz) Exam:   General:  Pt is alert, follows commands appropriately, not in acute distress  HEENT: No icterus, No  thrush, No neck mass, [place]/AT  Cardiovascular: RRR, S1/S2, no rubs, no gallops  Respiratory: CTA bilaterally, no wheezing, no crackles, no rhonchi  Abdomen: Soft/+BS, non tender, non distended, no guarding  Extremities: No edema, No lymphangitis, No petechiae, No rashes, no synovitis  Data Reviewed: Basic Metabolic Panel:  Recent Labs Lab 03/07/15 1959  03/08/15 1527 03/09/15 0525 03/09/15 2312 03/10/15 0530 03/11/15 0124 03/11/15 0438  NA 139  --  133* 140 139 140 139 141  K 6.0*  < > 6.6* 3.7 2.9* 3.7 3.1* 3.2*  CL 103  --  102 103 101 108 103 110  CO2 18*  --  25 26  --  23  --  18*  GLUCOSE 98  --  130* 117* 120* 110* 104* 120*  BUN 14  --  7 7 7 7  4* <5*  CREATININE 0.93  --  0.81 1.15 0.90 0.96 0.60* 0.78  CALCIUM 8.9  --  7.5* 7.5*  --  7.5*  --  8.1*  < > = values in this interval not displayed. Liver Function Tests:  Recent Labs Lab 03/07/15 1959 03/09/15 0525 03/10/15 0530 03/11/15 0438  AST 68* 23 17 9*  ALT 71* 31 10* 7*  ALKPHOS 44 34* 14* 8*  BILITOT 2.4* 1.2 1.9* 1.1  PROT 6.5 6.2* 4.7* 4.8*  ALBUMIN 4.2 3.2* 3.7 4.3    Recent Labs Lab 03/07/15 1959  LIPASE 86*   No results for input(s): AMMONIA in the last 168 hours. CBC:  Recent Labs Lab 03/07/15 1959 03/09/15 0525 03/09/15 2312 03/10/15 0530 03/11/15 0124 03/11/15 0438  WBC 16.3* 13.8*  --  11.7*  --  13.6*  NEUTROABS 12.2*  --   --   --   --   --   HGB 14.5 13.5 12.6* 11.9* 12.2* 11.8*  HCT 40.0 40.2 37.0* 35.5* 36.0* 35.2*  MCV 87.1 91.0  --  90.8  --  90.3  PLT 200 142*  --  110*  --  121*   Cardiac Enzymes:  Recent Labs Lab  03/08/15 1527  TROPONINI <0.03   BNP: Invalid input(s): POCBNP CBG: No results for input(s): GLUCAP in the last 168 hours.  Recent Results (from the past 240 hour(s))  Culture, blood (routine x 2)     Status: None (Preliminary result)   Collection Time: 03/09/15  9:49 PM  Result Value Ref Range Status   Specimen Description BLOOD RIGHT ARM   Final   Special Requests BOTTLES DRAWN AEROBIC AND ANAEROBIC 10ML  Final   Culture NO GROWTH 1 DAY  Final   Report Status PENDING  Incomplete  Culture, blood (routine x 2)     Status: None (Preliminary result)   Collection Time: 03/09/15  9:49 PM  Result Value Ref Range Status   Specimen Description BLOOD RIGHT ANTECUBITAL  Final   Special Requests BOTTLES DRAWN AEROBIC AND ANAEROBIC 10CC   Final   Culture NO GROWTH 2 DAYS  Final   Report Status PENDING  Incomplete  Culture, blood (single) w Reflex to ID Panel     Status: None (Preliminary result)   Collection Time: 03/09/15 11:10 PM  Result Value Ref Range Status   Specimen Description BLOOD PORTA CATH  Final   Special Requests BOTTLES DRAWN AEROBIC AND ANAEROBIC 10ML  Final   Culture NO GROWTH 1 DAY  Final   Report Status PENDING  Incomplete     Scheduled Meds: . calcium gluconate  1 g Intravenous Once  . fenofibrate  160 mg Oral Daily  . polyethylene glycol  17 g Oral Daily  . sodium chloride flush  3 mL Intravenous Q12H  . thiamine  100 mg Oral Daily   Or  . thiamine  100 mg Intravenous Daily   Continuous Infusions: . sodium chloride 125 mL/hr at 03/11/15 1203     Shadi Larner, DO  [facility] Pager [phone]  If 7PM-7AM, please contact night-coverage [email or web address] Password [email or web address] 03/11/2015, 6:45 PM   LOS: 3 days

## 2015-03-12 LAB — BASIC METABOLIC PANEL
ANION GAP: 8 (ref 5–15)
BUN: 7 mg/dL (ref 6–20)
CALCIUM: 8.3 mg/dL — AB (ref 8.9–10.3)
CHLORIDE: 110 mmol/L (ref 101–111)
CO2: 24 mmol/L (ref 22–32)
CREATININE: 0.79 mg/dL (ref 0.61–1.24)
GFR calc non Af Amer: >60 mL/min (ref >60)
Glucose, Bld: 97 mg/dL (ref 65–99)
Potassium: 3.5 mmol/L (ref 3.5–5.1)
SODIUM: 142 mmol/L (ref 135–145)

## 2015-03-12 LAB — CBC
HCT: 35.7 % — ABNORMAL LOW (ref 39.0–52.0)
HEMOGLOBIN: 12 g/dL — AB (ref 13.0–17.0)
MCH: 30.3 pg (ref 26.0–34.0)
MCHC: 33.6 g/dL (ref 30.0–36.0)
MCV: 90.2 fL (ref 78.0–100.0)
PLATELETS: 160 10*3/uL (ref 150–400)
RBC: 3.96 MIL/uL — AB (ref 4.22–5.81)
RDW: 14.3 % (ref 11.5–15.5)
WBC: 10.7 10*3/uL — AB (ref 4.0–10.5)

## 2015-03-12 LAB — TRIGLYCERIDES: TRIGLYCERIDES: 320 mg/dL — AB (ref <150)

## 2015-03-12 MED ORDER — FENOFIBRATE 145 MG PO TABS: 145.0000 mg | ORAL_TABLET | Freq: Every day | ORAL | Status: DC

## 2015-03-12 MED ORDER — CHOLECALCIFEROL 50 MCG (2000 UT) PO TABS: 2000.0000 [IU] | ORAL_TABLET | Freq: Every day | ORAL | Status: DC

## 2015-03-12 MED ORDER — ATORVASTATIN CALCIUM 40 MG PO TABS: 40.0000 mg | ORAL_TABLET | Freq: Every day | ORAL | Status: DC

## 2015-03-12 NOTE — Discharge Summary (Signed)
Physician Discharge Summary  Alex Stevens [id number] DOB: 1976-01-11 DOA: 03/07/2015  PCP: No primary care provider on file.  Admit date: 03/07/2015 Discharge date: 03/12/2015  Recommendations for Outpatient Follow-up:  1. Pt will need to follow up with PCP in 2 weeks post discharge 2. Please obtain BMP, LFTs and CBC in 1-2 weeks  Discharge Diagnoses:  Acute pancreatitis -Secondary to hypertriglyceridemia -03/08/2015 abdominal ultrasound--negative cholelithiasis; hepatic steatosis -CT abdomen and pelvis--Pancreatic tail edema without necrosis -Continue IV fluids -Continue IV opioids -Start clear liquids--> advance to full liquid diet-->advance to soft diet -Triglycerides 1830-->327-->155 -Urine drug screen--+opiates only -place temporary dialysis catheter for plasmapheresis--2 sessions -03/11/15--removed dialysis catheter -consulted nephrology to assist with plasmapheresis -appreciate Dr. Posey Pronto -started fenofibrate -start statin if LFTs normalized and when triglycerides improving--home with lipitor 40 mg daily -daily triglycerides Transaminasemia -improved -Check viral hepatitis serology--neg -Check HIV-neg Hyperkalemia -Patient given Kayexalate x 2 -Repeat potassium--improved Leukocytosis -Likely stress demargination -The patient is afebrile and hemodynamically stable -Urinalysis negative for pyuria -CXR negative for infiltrates Alcohol dependence -Alcohol withdrawal protocol -no signs of withdraw Fever -Patient had temperature 101.73F on the evening of 03/09/2015; subsequently, the patient remained afebrile for greater than 24 hours prior to discharge -Remains hemodynamically stable -Likely due to patient's pancreatitis -Follow blood cultures--negative to date -We will not start antibiotics at this time Hypocalcemia -Supplement -Check 25 vitamin D--15.5--home with vitamin D 2000 IU daily Thrombocytopenia -HITantibody--neg -SCDs -d/c lovenox  Discharge  Condition: stable  Disposition: home  Diet:heart healthy Wt Readings from Last 3 Encounters:  03/11/15 92.8 kg (204 lb 9.4 oz)  01/23/14 88.565 kg (195 lb 4 oz)    History of present illness:  40 year old male with no significant past medical history presented with acute onset of epigastric abdominal pain in the morning of 03/07/2015. The patient states that he drank at least 8 beers for his birthday on [birth year]05/2015. On a routine week, the patient states that he usually drinks 3-4 beers. He denies any liquor alcohol or illegal drug use. He has some nausea without emesis, diarrhea, hematochezia, melena. Workup revealed AST 68, ALT 71,total bilirubin 2.4, lipase 8.6. CT of the abdomen and pelvis revealed fatty liver and peripancreatic tail edema. The patient was treated with intravenous opioids and started on intravenous fluids.  He was found to have triglycerides 1830. The patient underwent plasmapheresis 2.  His triglycerides improved.  The patient was started on fenofibrate which he tolerated. He will be discharged home with Lipitor 40 mg daily in addition to his fenofibrate. His diet was gradually advanced, and he tolerated it.  Consultants: nephrology  Discharge Exam: Filed Vitals:   03/11/15 1958 03/12/15 0524  BP: 115/75 128/65  Pulse: 90 90  Temp: 98.3 F (36.8 C) 98.6 F (37 C)  Resp: 19 20   Filed Vitals:   03/11/15 0917 03/11/15 1706 03/11/15 1958 03/12/15 0524  BP: 122/75 123/72 115/75 128/65  Pulse: 93 89 90 90  Temp: 98.1 F (36.7 C) 98.7 F (37.1 C) 98.3 F (36.8 C) 98.6 F (37 C)  TempSrc: Oral Oral Oral Oral  Resp: 18 18 19 20   Height:      Weight:   92.8 kg (204 lb 9.4 oz)   SpO2: 98% 100% 99% 99%   General: A&O x 3, NAD, pleasant, cooperative Cardiovascular: RRR, no rub, no gallop, no S3 Respiratory: CTAB, no wheeze, no rhonchi Abdomen:soft, nontender, nondistended, positive bowel sounds Extremities: No edema, No lymphangitis, no petechiae  Discharge  Instructions      Discharge Instructions  Diet - low sodium heart healthy    Complete by:  As directed      Increase activity slowly    Complete by:  As directed             Medication List    STOP taking these medications        HYDROcodone-acetaminophen 5-325 MG tablet  Commonly known as:  NORCO      TAKE these medications        atorvastatin 40 MG tablet  Commonly known as:  LIPITOR  Take 1 tablet (40 mg total) by mouth daily.     Cholecalciferol 2000 units Tabs  Take 1 tablet (2,000 Units total) by mouth daily.     fenofibrate 145 MG tablet  Commonly known as:  TRICOR  Take 1 tablet (145 mg total) by mouth daily.         The results of significant diagnostics from this hospitalization (including imaging, microbiology, ancillary and laboratory) are listed below for reference.    Significant Diagnostic Studies: US Abdomen Complete  03/08/2015  CLINICAL DATA:  Right upper quadrant pain for 1 day.  Elevated LFTs. EXAM: ABDOMEN ULTRASOUND COMPLETE COMPARISON:  None. FINDINGS: Gallbladder: Physiologically distended. No gallstones or wall thickening visualized. No sonographic Murphy sign noted by sonographer. Common bile duct: Diameter: 3 mm, however difficult to visualize. Liver: Diffusely increased and heterogeneous in parenchymal echogenicity.No focal lesion identified, however limited penetration. Normal directional flow in the main portal vein. IVC: No abnormality visualized. Pancreas: Visualized portion unremarkable. Pancreatic duct is normal measuring 3 mm. Majority of the pancreas is obscured pre Spleen: Size and appearance within normal limits. Right Kidney: Length: 10.4 cm. Echogenicity within normal limits. No mass or hydronephrosis visualized. Left Kidney: Length: 10.8 cm. Echogenicity within normal limits. No mass or hydronephrosis visualized. Abdominal aorta: No aneurysm visualized. Only proximal aorta is visualized, mid, distal iliac bifurcation are obscured.  Other findings: None.  No ascites. IMPRESSION: 1. Hepatic steatosis. 2. Normal sonographic appearance of gallbladder. 3. Midline structures including pancreas and abdominal aorta partially obscured. Electronically Signed   By: Jeb Levering M.D.   On: 03/08/2015 01:24   Ct Abdomen Pelvis W Contrast  03/08/2015  CLINICAL DATA:  Upper abdominal pain for 1 day with nausea EXAM: CT ABDOMEN AND PELVIS WITH CONTRAST TECHNIQUE: Multidetector CT imaging of the abdomen and pelvis was performed using the standard protocol following bolus administration of intravenous contrast. CONTRAST:  181mL OMNIPAQUE IOHEXOL 300 MG/ML  SOLN COMPARISON:  None. FINDINGS: Lower chest and abdominal wall:  No contributory findings. Hepatobiliary: Dense hepatic steatosis.No evidence of biliary obstruction or stone. Pancreas: Expanded tail the pancreas with surrounding fat edema. No fluid collection, necrosis, or vascular complication. Spleen: Unremarkable. Adrenals/Urinary Tract: Negative adrenals. No hydronephrosis or stone. Unremarkable bladder. Reproductive:No pathologic findings. Stomach/Bowel:  No obstruction. No appendicitis. Vascular/Lymphatic: No acute vascular abnormality. No mass or adenopathy. Peritoneal: No ascites or pneumoperitoneum. Musculoskeletal: No acute abnormalities. IMPRESSION: 1. Acute pancreatitis without necrosis or collection. 2. Marked hepatic steatosis. Electronically Signed   By: Monte Fantasia M.D.   On: 03/08/2015 02:32   Ir Fluoro Guide Cv Line Right  03/09/2015  CLINICAL DATA:  Hypertriglyceridemia and acute pancreatitis. The patient requires a non tunneled catheter for emergent plasma exchange. EXAM: NON-TUNNELED CENTRAL VENOUS CATHETER PLACEMENT WITH ULTRASOUND AND FLUOROSCOPIC GUIDANCE FLUOROSCOPY TIME:  6 seconds. PROCEDURE: The procedure, risks, benefits, and alternatives were explained to the patient. Questions regarding the procedure were encouraged and answered. The patient understands and  consents  to the procedure. A time-out was performed prior to the procedure. The right neck and chest were prepped with chlorhexidine in a sterile fashion, and a sterile drape was applied covering the operative field. Maximum barrier sterile technique with sterile gowns and gloves were used for the procedure. Local anesthesia was provided with 1% lidocaine. After creating a small venotomy incision, a 19 gauge needle was advanced into the right internal jugular vein under direct, real-time ultrasound guidance. Ultrasound image documentation was performed. A guidewire was advanced through the needle. The venotomy was dilated. A 13 French, 20 cm length Trialysis catheter was advanced over the wire. The catheter was aspirated, flushed with saline, and injected with appropriate volume heparin dwells. The catheter exit site was secured with 0-Prolene retention sutures. COMPLICATIONS: None.  No pneumothorax. FINDINGS: After catheter placement, the tip lies in the right atrium. The catheter aspirates normally and is ready for immediate use. IMPRESSION: Placement of non-tunneled central venous catheter via the left internal jugular vein. The catheter tip lies in the right atrium. The catheter is ready for immediate use. Electronically Signed   By: Aletta Edouard M.D.   On: 03/09/2015 17:14   Ir US Guide Vasc Access Right  03/09/2015  CLINICAL DATA:  Hypertriglyceridemia and acute pancreatitis. The patient requires a non tunneled catheter for emergent plasma exchange. EXAM: NON-TUNNELED CENTRAL VENOUS CATHETER PLACEMENT WITH ULTRASOUND AND FLUOROSCOPIC GUIDANCE FLUOROSCOPY TIME:  6 seconds. PROCEDURE: The procedure, risks, benefits, and alternatives were explained to the patient. Questions regarding the procedure were encouraged and answered. The patient understands and consents to the procedure. A time-out was performed prior to the procedure. The right neck and chest were prepped with chlorhexidine in a sterile fashion,  and a sterile drape was applied covering the operative field. Maximum barrier sterile technique with sterile gowns and gloves were used for the procedure. Local anesthesia was provided with 1% lidocaine. After creating a small venotomy incision, a 19 gauge needle was advanced into the right internal jugular vein under direct, real-time ultrasound guidance. Ultrasound image documentation was performed. A guidewire was advanced through the needle. The venotomy was dilated. A 13 French, 20 cm length Trialysis catheter was advanced over the wire. The catheter was aspirated, flushed with saline, and injected with appropriate volume heparin dwells. The catheter exit site was secured with 0-Prolene retention sutures. COMPLICATIONS: None.  No pneumothorax. FINDINGS: After catheter placement, the tip lies in the right atrium. The catheter aspirates normally and is ready for immediate use. IMPRESSION: Placement of non-tunneled central venous catheter via the left internal jugular vein. The catheter tip lies in the right atrium. The catheter is ready for immediate use. Electronically Signed   By: Aletta Edouard M.D.   On: 03/09/2015 17:14   Dg Chest Port 1 View  03/08/2015  CLINICAL DATA:  Pancreatitis EXAM: PORTABLE CHEST 1 VIEW COMPARISON:  None. FINDINGS: Low volumes with reticular densities at the left base correlating with mild atelectasis on prior CT. There is no edema, consolidation, effusion, or pneumothorax. Normal heart size and mediastinal contours. IMPRESSION: Low volumes with mild atelectasis. Electronically Signed   By: Monte Fantasia M.D.   On: 03/08/2015 03:41     Microbiology: Recent Results (from the past 240 hour(s))  Culture, blood (routine x 2)     Status: None (Preliminary result)   Collection Time: 03/09/15  9:49 PM  Result Value Ref Range Status   Specimen Description BLOOD RIGHT ARM  Final   Special Requests BOTTLES DRAWN AEROBIC AND ANAEROBIC  10ML  Final   Culture NO GROWTH 1 DAY  Final    Report Status PENDING  Incomplete  Culture, blood (routine x 2)     Status: None (Preliminary result)   Collection Time: 03/09/15  9:49 PM  Result Value Ref Range Status   Specimen Description BLOOD RIGHT ANTECUBITAL  Final   Special Requests BOTTLES DRAWN AEROBIC AND ANAEROBIC 10CC   Final   Culture NO GROWTH 2 DAYS  Final   Report Status PENDING  Incomplete  Culture, blood (single) w Reflex to ID Panel     Status: None (Preliminary result)   Collection Time: 03/09/15 11:10 PM  Result Value Ref Range Status   Specimen Description BLOOD PORTA CATH  Final   Special Requests BOTTLES DRAWN AEROBIC AND ANAEROBIC 10ML  Final   Culture NO GROWTH 1 DAY  Final   Report Status PENDING  Incomplete     Labs: Basic Metabolic Panel:  Recent Labs Lab 03/08/15 1527 03/09/15 0525 03/09/15 2312 03/10/15 0530 03/11/15 0124 03/11/15 0438 03/12/15 0534  NA 133* 140 139 140 139 141 142  K 6.6* 3.7 2.9* 3.7 3.1* 3.2* 3.5  CL 102 103 101 108 103 110 110  CO2 25 26  --  23  --  18* 24  GLUCOSE 130* 117* 120* 110* 104* 120* 97  BUN 7 7 7 7  4* <5* 7  CREATININE 0.81 1.15 0.90 0.96 0.60* 0.78 0.79  CALCIUM 7.5* 7.5*  --  7.5*  --  8.1* 8.3*   Liver Function Tests:  Recent Labs Lab 03/07/15 1959 03/09/15 0525 03/10/15 0530 03/11/15 0438  AST 68* 23 17 9*  ALT 71* 31 10* 7*  ALKPHOS 44 34* 14* 8*  BILITOT 2.4* 1.2 1.9* 1.1  PROT 6.5 6.2* 4.7* 4.8*  ALBUMIN 4.2 3.2* 3.7 4.3    Recent Labs Lab 03/07/15 1959  LIPASE 86*   No results for input(s): AMMONIA in the last 168 hours. CBC:  Recent Labs Lab 03/07/15 1959 03/09/15 0525 03/09/15 2312 03/10/15 0530 03/11/15 0124 03/11/15 0438 03/12/15 0534  WBC 16.3* 13.8*  --  11.7*  --  13.6* 10.7*  NEUTROABS 12.2*  --   --   --   --   --   --   HGB 14.5 13.5 12.6* 11.9* 12.2* 11.8* 12.0*  HCT 40.0 40.2 37.0* 35.5* 36.0* 35.2* 35.7*  MCV 87.1 91.0  --  90.8  --  90.3 90.2  PLT 200 142*  --  110*  --  121* 160   Cardiac  Enzymes:  Recent Labs Lab 03/08/15 1527  TROPONINI <0.03   BNP: Invalid input(s): POCBNP CBG: No results for input(s): GLUCAP in the last 168 hours.  Time coordinating discharge:  Greater than 30 minutes  Signed:  Haniyah Maciolek, DO [facility] Pager: [phone] 03/12/2015, 11:24 AM

## 2015-03-12 NOTE — Progress Notes (Signed)
Discharge instructions and medications discussed with patient.  Prescription given to patient.  All questions answered.  

## 2015-03-14 LAB — CULTURE, BLOOD (ROUTINE X 2): Culture: NO GROWTH

## 2015-03-15 LAB — CULTURE, BLOOD (SINGLE): CULTURE: NO GROWTH

## 2015-03-15 LAB — CULTURE, BLOOD (ROUTINE X 2): Culture: NO GROWTH

## 2015-03-16 DIAGNOSIS — F430 Acute stress reaction: Secondary | ICD-10-CM | POA: Insufficient documentation

## 2015-03-20 DIAGNOSIS — E559 Vitamin D deficiency, unspecified: Secondary | ICD-10-CM | POA: Insufficient documentation

## 2015-05-05 LAB — HEPATIC FUNCTION PANEL
ALT: 21 (ref 10–40)
AST: 15 (ref 14–40)
Alkaline Phosphatase: 48 (ref 25–125)
Bilirubin, Total: 0.5

## 2015-05-05 LAB — BASIC METABOLIC PANEL
BUN: 19 (ref 4–21)
Creatinine: 1 (ref 0.6–1.3)
Glucose: 104
Potassium: 5 (ref 3.4–5.3)
Sodium: 142 (ref 137–147)

## 2015-05-05 LAB — CBC AND DIFFERENTIAL
HCT: 43 (ref 41–53)
Hemoglobin: 13.8 (ref 13.5–17.5)
Neutrophils Absolute: 2
Platelets: 193 (ref 150–399)
WBC: 5.1

## 2015-05-05 LAB — LIPID PANEL
Cholesterol: 165 (ref 0–200)
HDL: 4 — AB (ref 35–70)
LDL Cholesterol: 107
Triglycerides: 203 — AB (ref 40–160)

## 2015-05-05 LAB — TSH: TSH: 1.27 (ref 0.41–5.90)

## 2015-05-08 DIAGNOSIS — R7301 Impaired fasting glucose: Secondary | ICD-10-CM | POA: Insufficient documentation

## 2016-06-26 IMAGING — CT CT ABD-PELV W/ CM
2 of 5 series · 16 of 46 positions shown, 18 images · IV contrast (Omni 300)
Comparison: None.

CLINICAL DATA: Upper abdominal pain for 1 day with nausea

EXAM:
CT ABDOMEN AND PELVIS WITH CONTRAST
TECHNIQUE: Multidetector CT imaging of the abdomen and pelvis was performed
using the standard protocol following bolus administration of
intravenous contrast.
CONTRAST:  100mL OMNIPAQUE IOHEXOL 300 MG/ML  SOLN

[Series 2: a/p w/ 5mm · axial · 0.79mm/px · z∈[+682,+1137]mm · 13 of 103 slices shown, 15 images]
[im 6/103  soft-tissue]
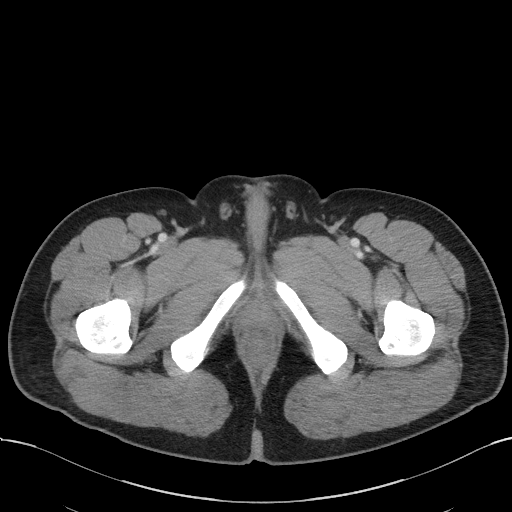
[im 6/103  bone]
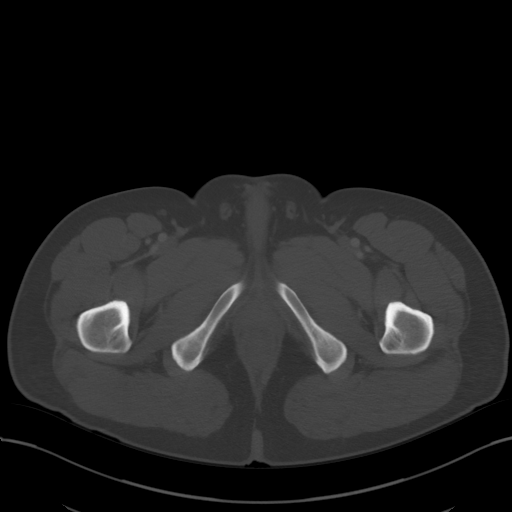
[im 17/103  soft-tissue]
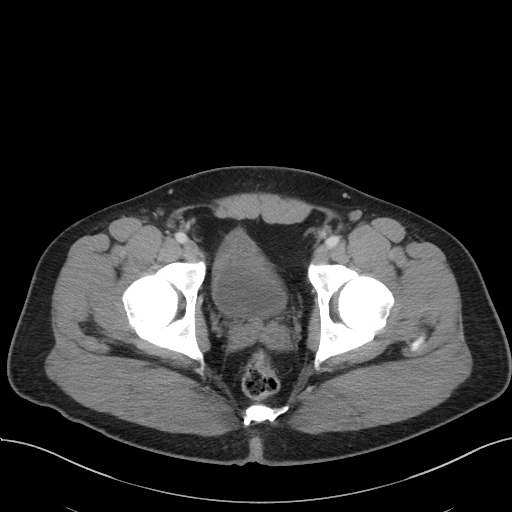
[im 22/103  soft-tissue]
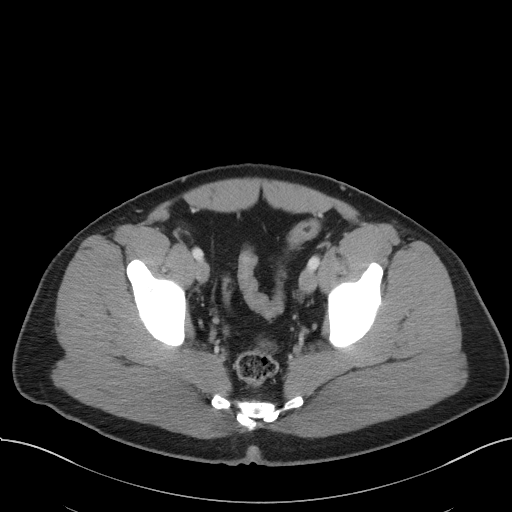
[im 27/103  soft-tissue]
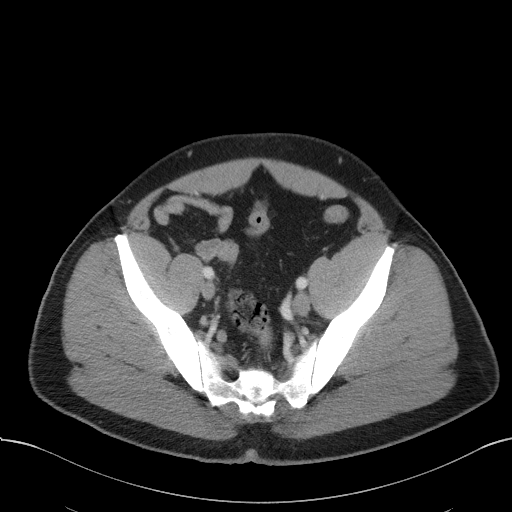
[im 38/103  soft-tissue]
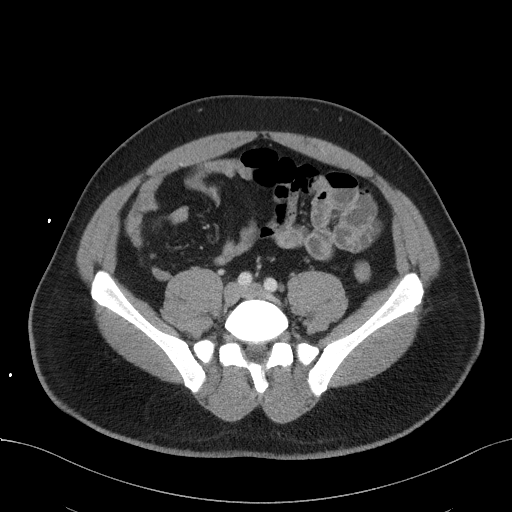
[im 43/103  soft-tissue]
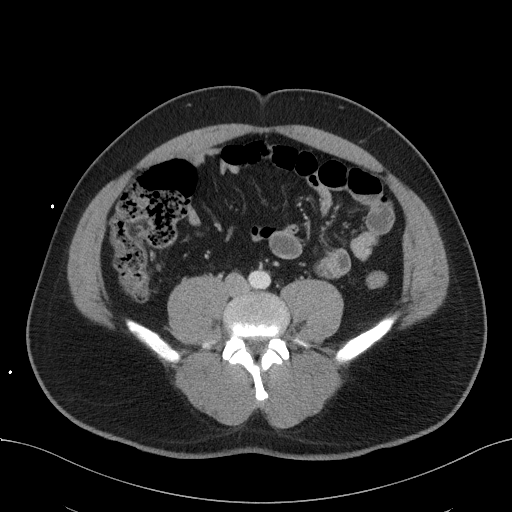
[im 54/103  soft-tissue]
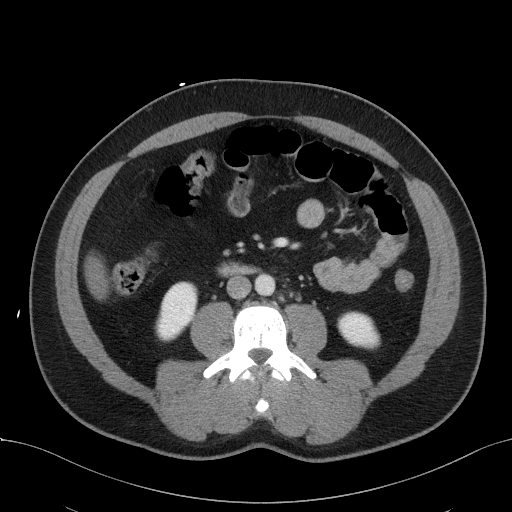
[im 60/103  soft-tissue]
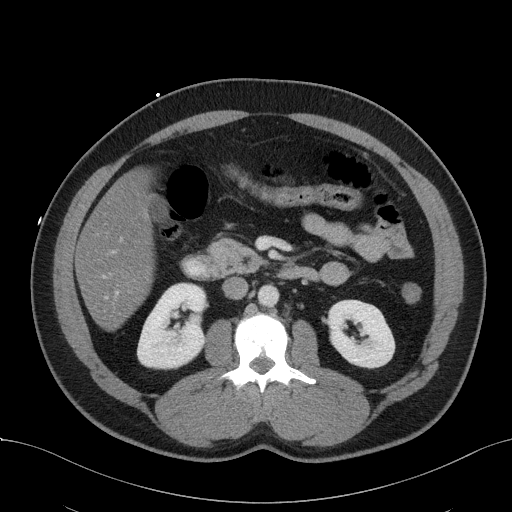
[im 65/103  soft-tissue]
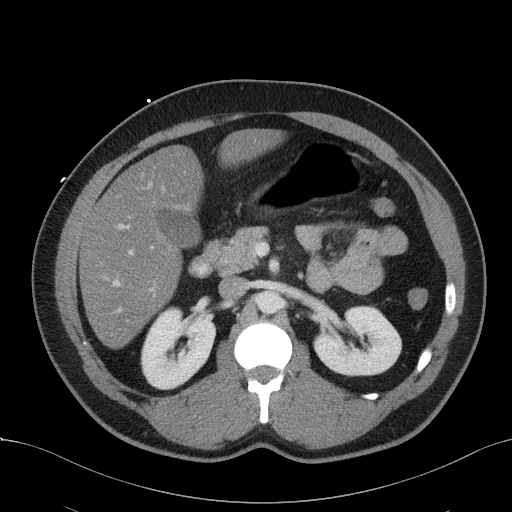
[im 65/103  bone]
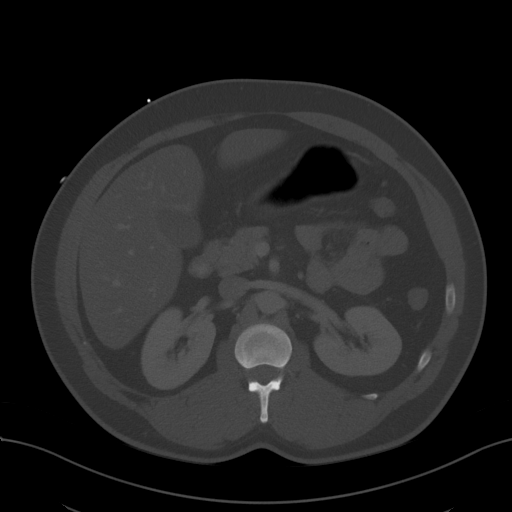
[im 76/103  soft-tissue]
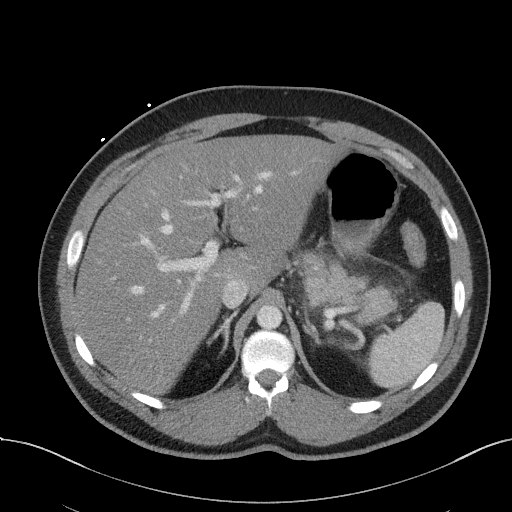
[im 81/103  soft-tissue]
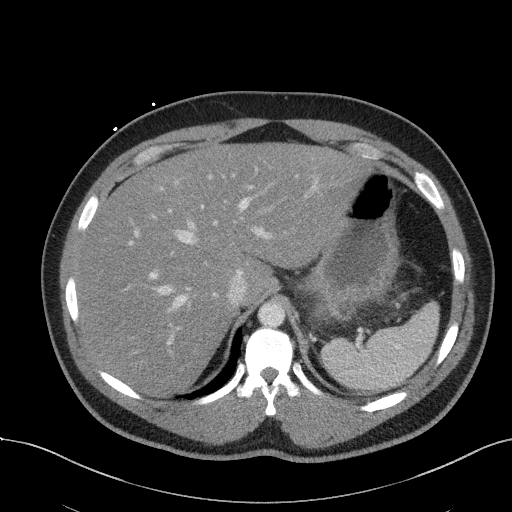
[im 86/103  soft-tissue]
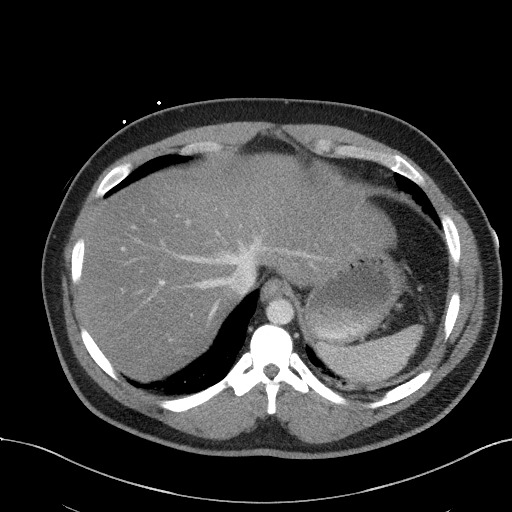
[im 97/103  soft-tissue]
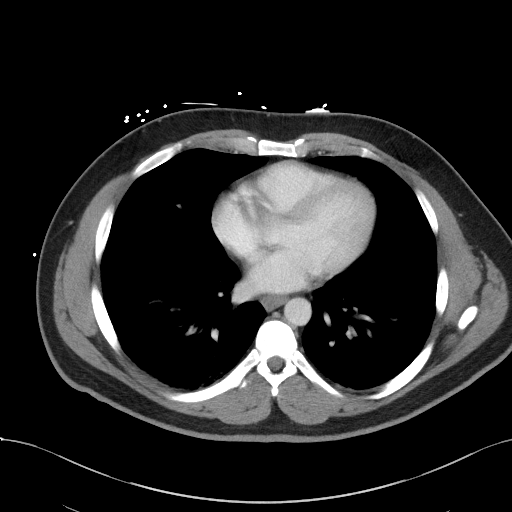

[Series 5: a/p w/ cor · coronal · 0.76mm/px · 3 of 151 slices shown]
[im 51/151  soft-tissue]
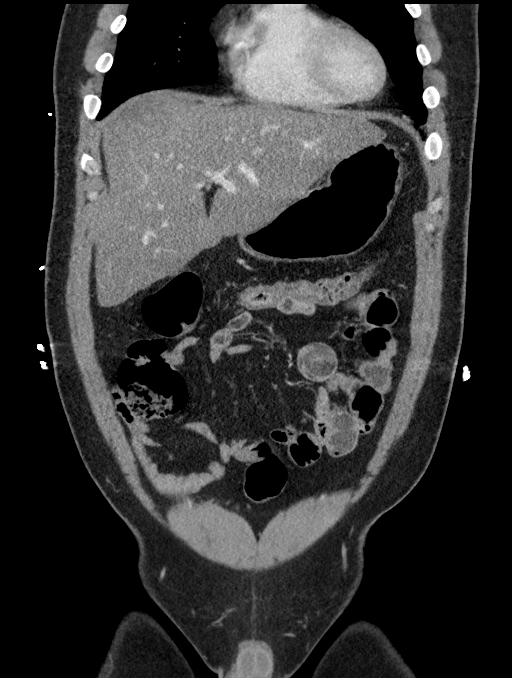
[im 67/151  soft-tissue]
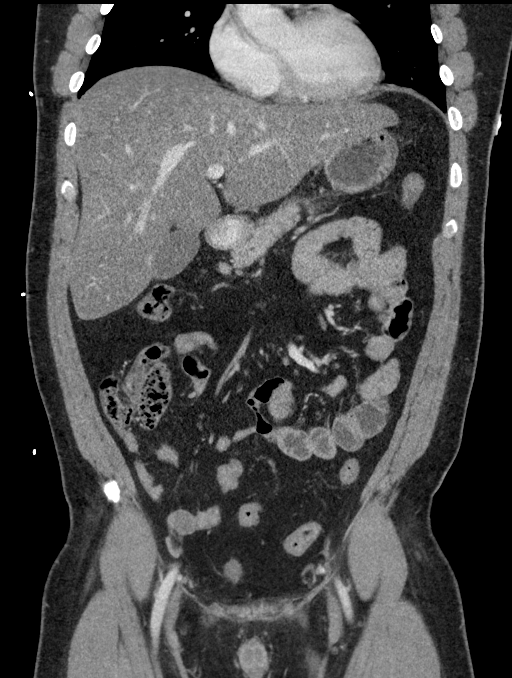
[im 84/151  soft-tissue]
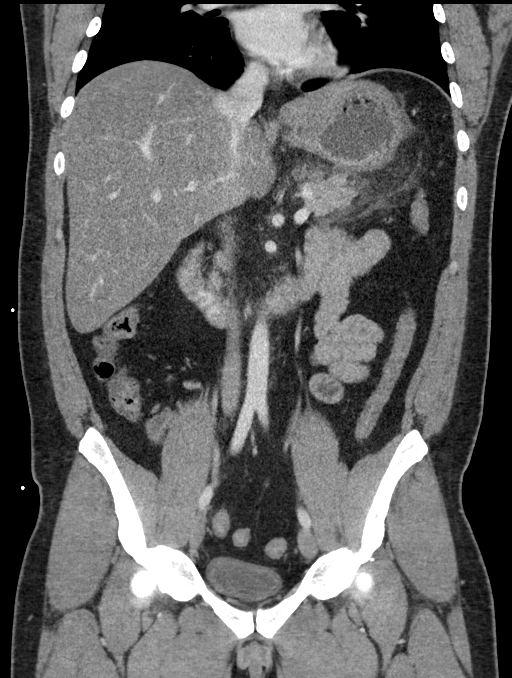

[16 of 46 positions shown; findings below may reference images not displayed]

FINDINGS: Lower chest and abdominal wall:  No contributory findings.

Hepatobiliary: Dense hepatic steatosis.No evidence of biliary
obstruction or stone.

Pancreas: Expanded tail the pancreas with surrounding fat edema. No
fluid collection, necrosis, or vascular complication.

Spleen: Unremarkable.

Adrenals/Urinary Tract: Negative adrenals. No hydronephrosis or
stone. Unremarkable bladder.

Reproductive:No pathologic findings.

Stomach/Bowel:  No obstruction. No appendicitis.

Vascular/Lymphatic: No acute vascular abnormality. No mass or
adenopathy.

Peritoneal: No ascites or pneumoperitoneum.

Musculoskeletal: No acute abnormalities.
IMPRESSION: 1. Acute pancreatitis without necrosis or collection.
2. Marked hepatic steatosis.

## 2016-06-27 IMAGING — US IR US GUIDE VASC ACCESS RIGHT
1 series · 1 of 1 positions shown · non-contrast
Comparison: none

CLINICAL DATA: Hypertriglyceridemia and acute pancreatitis. The
patient requires a non tunneled catheter for emergent plasma
exchange.

[Series 1: ir fluoro/shunt/fist · 1 of 1 slices shown]
[im 1/1]
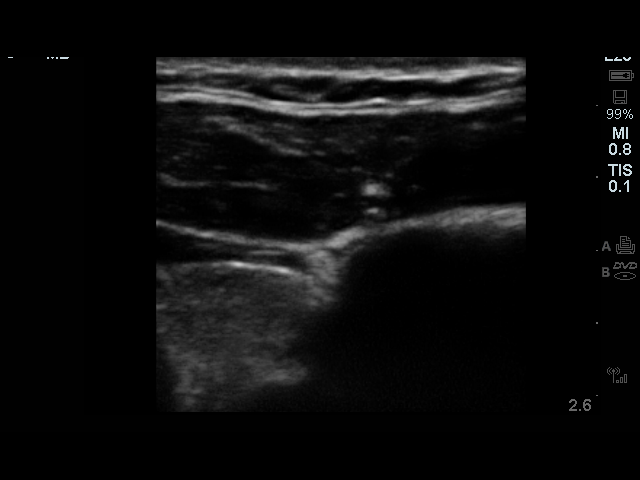

[1 of 1 positions shown; findings below may reference images not displayed]

EXAM:
NON-TUNNELED CENTRAL VENOUS CATHETER PLACEMENT WITH ULTRASOUND AND
FLUOROSCOPIC GUIDANCE

FLUOROSCOPY TIME:  6 seconds.

PROCEDURE:
The procedure, risks, benefits, and alternatives were explained to
the patient. Questions regarding the procedure were encouraged and
answered. The patient understands and consents to the procedure. A
time-out was performed prior to the procedure.

The right neck and chest were prepped with chlorhexidine in a
sterile fashion, and a sterile drape was applied covering the
operative field. Maximum barrier sterile technique with sterile
gowns and gloves were used for the procedure. Local anesthesia was
provided with 1% lidocaine.

After creating a small venotomy incision, a 19 gauge needle was
advanced into the right internal jugular vein under direct,
real-time ultrasound guidance. Ultrasound image documentation was
performed. A guidewire was advanced through the needle. The venotomy
was dilated. A 13 French, 20 cm length Trialysis catheter was
advanced over the wire.

The catheter was aspirated, flushed with saline, and injected with
appropriate volume heparin dwells. The catheter exit site was
secured with 0-Prolene retention sutures.

COMPLICATIONS:
None.  No pneumothorax.
FINDINGS: After catheter placement, the tip lies in the right atrium. The
catheter aspirates normally and is ready for immediate use.
IMPRESSION: Placement of non-tunneled central venous catheter via the left
internal jugular vein. The catheter tip lies in the right atrium.
The catheter is ready for immediate use.

## 2018-12-07 ENCOUNTER — Ambulatory Visit (INDEPENDENT_AMBULATORY_CARE_PROVIDER_SITE_OTHER): Payer: No Typology Code available for payment source | Admitting: Family Medicine

## 2018-12-07 ENCOUNTER — Other Ambulatory Visit: Payer: Self-pay

## 2018-12-07 ENCOUNTER — Encounter: Payer: Self-pay | Admitting: Family Medicine

## 2018-12-07 VITALS — BP 123/80 | HR 73 | Temp 98.6°F | Resp 16 | Ht 67.0 in | Wt 191.0 lb

## 2018-12-07 DIAGNOSIS — Z8719 Personal history of other diseases of the digestive system: Secondary | ICD-10-CM

## 2018-12-07 DIAGNOSIS — E782 Mixed hyperlipidemia: Secondary | ICD-10-CM

## 2018-12-07 DIAGNOSIS — Z0000 Encounter for general adult medical examination without abnormal findings: Secondary | ICD-10-CM | POA: Diagnosis not present

## 2018-12-07 DIAGNOSIS — Z23 Encounter for immunization: Secondary | ICD-10-CM | POA: Diagnosis not present

## 2018-12-07 NOTE — Patient Instructions (Signed)

## 2018-12-07 NOTE — Progress Notes (Signed)
Office Note 12/07/2018  CC:  Chief Complaint  Patient presents with  . Establish Care    Previous PCP, [facility] on [address]   HPI:  Alex Stevens is a 43 y.o. male who is here to establish care. Patient's most recent primary MD: see above.  Moved to here b/c of location to home. Old records in EPIC/[facility] EMR were reviewed prior to or during today's visit.  Had bloated feeling and upper back pain this summer and felt that this may be recurrence of pancreatitis. He stopped eating and drinking a couple days and sx's resolved.    Diet: trying to eat low carb diet last few weeks. Exercising: goes to gym and does wts 3 days per week, no cardio.  No lipid checks in last few years, has not taken his statin or fenofibrate in a few years. Says he ran out of meds and had lost some wt and improved diet so he did not f/u and get more med or get recheck of labs.  Past Medical History:  Diagnosis Date  . Acute pancreatitis    ? alc?   Severe hypertriglyceridemia (required plasmaphoresis 2017)  . Alcohol abuse    question of->pt denies but records from admission for pancreatitis 2017 state + "alcohol dependency".  . Hepatic steatosis   . Hyperlipemia, mixed     History reviewed. No pertinent surgical history.  Family History  Problem Relation Age of Onset  . Heart disease Paternal Grandmother     Social History   Socioeconomic History  . Marital status: Married    Spouse name: Not on file  . Number of children: Not on file  . Years of education: Not on file  . Highest education level: Not on file  Occupational History  . Not on file  Social Needs  . Financial resource strain: Not on file  . Food insecurity    Worry: Not on file    Inability: Not on file  . Transportation needs    Medical: Not on file    Non-medical: Not on file  Tobacco Use  . Smoking status: Never Smoker  . Smokeless tobacco: Current User    Types: Chew  Substance and Sexual Activity   . Alcohol use: Yes  . Drug use: No  . Sexual activity: Not on file  Lifestyle  . Physical activity    Days per week: Not on file    Minutes per session: Not on file  . Stress: Not on file  Relationships  . Social Herbalist on phone: Not on file    Gets together: Not on file    Attends religious service: Not on file    Active member of club or organization: Not on file    Attends meetings of clubs or organizations: Not on file    Relationship status: Not on file  . Intimate partner violence    Fear of current or ex partner: Not on file    Emotionally abused: Not on file    Physically abused: Not on file    Forced sexual activity: Not on file  Other Topics Concern  . Not on file  Social History Narrative   Married, 3 daughters, 2 sons.   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   NO tob.   Alcohol->2-3 beers per week.   Denies hx of problem with alcohol, never used drugs.    Outpatient Encounter Medications as of 12/07/2018  Medication Sig  . [DISCONTINUED] atorvastatin (LIPITOR) 40 MG tablet Take 1 tablet (40 mg total) by mouth daily. (Patient not taking: Reported on 12/07/2018)  . [DISCONTINUED] Cholecalciferol 2000 units TABS Take 1 tablet (2,000 Units total) by mouth daily. (Patient not taking: Reported on 12/07/2018)  . [DISCONTINUED] fenofibrate (TRICOR) 145 MG tablet Take 1 tablet (145 mg total) by mouth daily. (Patient not taking: Reported on 12/07/2018)   No facility-administered encounter medications on file as of 12/07/2018.     No Known Allergies  ROS Review of Systems  Constitutional: Negative for appetite change, chills, fatigue and fever.  HENT: Negative for congestion, dental problem, ear pain and sore throat.   Eyes: Negative for discharge, redness and visual disturbance.  Respiratory: Negative for cough, chest tightness, shortness of breath and wheezing.   Cardiovascular: Negative for chest pain, palpitations and  leg swelling.  Gastrointestinal: Negative for abdominal pain, blood in stool, diarrhea, nausea and vomiting.  Genitourinary: Negative for difficulty urinating, dysuria, flank pain, frequency, hematuria and urgency.  Musculoskeletal: Negative for arthralgias, back pain, joint swelling, myalgias and neck stiffness.  Skin: Negative for pallor and rash.  Neurological: Negative for dizziness, speech difficulty, weakness and headaches.  Hematological: Negative for adenopathy. Does not bruise/bleed easily.  Psychiatric/Behavioral: Negative for confusion and sleep disturbance. The patient is not nervous/anxious.     PE; Blood pressure 123/80, pulse 73, temperature 98.6 F (37 C), temperature source Temporal, resp. rate 16, height 5\' 7"  (1.702 m), weight 191 lb (86.6 kg), SpO2 97 %. Body mass index is 29.91 kg/m.  Gen: Alert, well appearing.  Patient is oriented to person, place, time, and situation. AFFECT: pleasant, lucid thought and speech. ENT: Ears: EACs clear, normal epithelium.  TMs with good light reflex and landmarks bilaterally.  Eyes: no injection, icteris, swelling, or exudate.  EOMI, PERRLA. Nose: no drainage or turbinate edema/swelling.  No injection or focal lesion.  Mouth: lips without lesion/swelling.  Oral mucosa pink and moist.  Dentition intact and without obvious caries or gingival swelling.  Oropharynx without erythema, exudate, or swelling.  Neck: supple/nontender.  No LAD, mass, or TM.  Carotid pulses 2+ bilaterally, without bruits. CV: RRR, no m/r/g.   LUNGS: CTA bilat, nonlabored resps, good aeration in all lung fields. ABD: soft, NT, ND, BS normal.  No hepatospenomegaly or mass.  No bruits. EXT: no clubbing, cyanosis, or edema.  Musculoskeletal: no joint swelling, erythema, warmth, or tenderness.  ROM of all joints intact. Skin - no sores or suspicious lesions or rashes or color changes   Pertinent labs:  None today  No results found for: TSH Lab Results  Component  Value Date   WBC 10.7 (H) 03/12/2015   HGB 12.0 (L) 03/12/2015   HCT 35.7 (L) 03/12/2015   MCV 90.2 03/12/2015   PLT 160 03/12/2015   Lab Results  Component Value Date   CREATININE 0.79 03/12/2015   BUN 7 03/12/2015   NA 142 03/12/2015   K 3.5 03/12/2015   CL 110 03/12/2015   CO2 24 03/12/2015   Lab Results  Component Value Date   ALT 7 (L) 03/11/2015   AST 9 (L) 03/11/2015   ALKPHOS 8 (L) 03/11/2015   BILITOT 1.1 03/11/2015   Lab Results  Component Value Date   CHOL 323 (H) 03/08/2015   Lab Results  Component Value Date   HDL NOT REPORTED DUE TO HIGH TRIGLYCERIDES 03/08/2015   Lab Results  Component Value Date   LDLCALC UNABLE TO CALCULATE IF TRIGLYCERIDE OVER  400 mg/dL 03/08/2015   Lab Results  Component Value Date   TRIG 320 (H) 03/12/2015   Lab Results  Component Value Date   CHOLHDL NOT REPORTED DUE TO HIGH TRIGLYCERIDES 03/08/2015   ASSESSMENT AND PLAN:   1) Mixed hyperlipidemia (hx of very severe hypertriglyceridemia). Last monitoring of this was about 2-3 yrs ago->he quit taking the atorva and fenofibrate shortly after that. Not fasting today: he'll return for fasting HP labs +HbA1c. He will continue to try to eat low carb/low fat diet.  2) Hx of acute pancreatitis 2017, secondary to severe hypertriglyceridemia (and ? Alcohol). Question of mild/brief recurrence a few months ago->resolved with pt not eating and drinking x 2d. He states he drinks about 2-3 beers per week and he denies any hx of alc abuse.  3) Health maintenance exam: Reviewed age and gender appropriate health maintenance issues (prudent diet, regular exercise, health risks of tobacco and excessive alcohol, use of seatbelts, fire alarms in home, use of sunscreen).  Also reviewed age and gender appropriate health screening as well as vaccine recommendations. Vaccines: flu vaccine today. Labs:fasting HP + A1c ordered->future.  An After Visit Summary was printed and given to the  patient.  F/u: to be determined based on results of labs and whether I restart meds or not.  Signed:  Crissie Sickles, MD           12/07/2018

## 2018-12-08 ENCOUNTER — Encounter: Payer: Self-pay | Admitting: Family Medicine

## 2018-12-15 ENCOUNTER — Other Ambulatory Visit: Payer: Self-pay

## 2018-12-15 ENCOUNTER — Ambulatory Visit (INDEPENDENT_AMBULATORY_CARE_PROVIDER_SITE_OTHER): Payer: No Typology Code available for payment source | Admitting: Family Medicine

## 2018-12-15 ENCOUNTER — Encounter: Payer: Self-pay | Admitting: Family Medicine

## 2018-12-15 DIAGNOSIS — E782 Mixed hyperlipidemia: Secondary | ICD-10-CM

## 2018-12-15 LAB — CBC WITH DIFFERENTIAL/PLATELET
Basophils Absolute: 0.1 10*3/uL (ref 0.0–0.1)
Basophils Relative: 2.3 % (ref 0.0–3.0)
Eosinophils Absolute: 0.1 10*3/uL (ref 0.0–0.7)
Eosinophils Relative: 2.1 % (ref 0.0–5.0)
HCT: 45.8 % (ref 39.0–52.0)
Hemoglobin: 15.1 g/dL (ref 13.0–17.0)
Lymphocytes Relative: 42.5 % (ref 12.0–46.0)
Lymphs Abs: 1.9 10*3/uL (ref 0.7–4.0)
MCHC: 32.9 g/dL (ref 30.0–36.0)
MCV: 92.7 fl (ref 78.0–100.0)
Monocytes Absolute: 0.4 10*3/uL (ref 0.1–1.0)
Monocytes Relative: 9.5 % (ref 3.0–12.0)
Neutro Abs: 2 10*3/uL (ref 1.4–7.7)
Neutrophils Relative %: 43.6 % (ref 43.0–77.0)
Platelets: 170 10*3/uL (ref 150.0–400.0)
RBC: 4.94 Mil/uL (ref 4.22–5.81)
RDW: 14.2 % (ref 11.5–15.5)
WBC: 4.5 10*3/uL (ref 4.0–10.5)

## 2018-12-15 LAB — COMPREHENSIVE METABOLIC PANEL
ALT: 22 U/L (ref 0–53)
AST: 19 U/L (ref 0–37)
Albumin: 4.6 g/dL (ref 3.5–5.2)
Alkaline Phosphatase: 43 U/L (ref 39–117)
BUN: 11 mg/dL (ref 6–23)
CO2: 28 mEq/L (ref 19–32)
Calcium: 9.3 mg/dL (ref 8.4–10.5)
Chloride: 101 mEq/L (ref 96–112)
Creatinine, Ser: 1.04 mg/dL (ref 0.40–1.50)
GFR: 77.66 mL/min (ref >60.00)
Glucose, Bld: 95 mg/dL (ref 70–99)
Potassium: 4.4 mEq/L (ref 3.5–5.1)
Sodium: 137 mEq/L (ref 135–145)
Total Bilirubin: 0.6 mg/dL (ref 0.2–1.2)
Total Protein: 7 g/dL (ref 6.0–8.3)

## 2018-12-15 LAB — LIPID PANEL
Cholesterol: 455 mg/dL — ABNORMAL HIGH (ref 0–200)
HDL: 32.4 mg/dL — ABNORMAL LOW (ref >39.00)
Total CHOL/HDL Ratio: 14
Triglycerides: 1375 mg/dL — ABNORMAL HIGH (ref 0.0–149.0)

## 2018-12-15 LAB — HEMOGLOBIN A1C: Hgb A1c MFr Bld: 5.6 % (ref 4.6–6.5)

## 2018-12-15 LAB — TSH: TSH: 2.16 u[IU]/mL (ref 0.35–4.50)

## 2018-12-15 LAB — LDL CHOLESTEROL, DIRECT: Direct LDL: 77 mg/dL

## 2018-12-16 ENCOUNTER — Other Ambulatory Visit: Payer: Self-pay

## 2018-12-16 DIAGNOSIS — E782 Mixed hyperlipidemia: Secondary | ICD-10-CM

## 2018-12-16 MED ORDER — ATORVASTATIN CALCIUM 40 MG PO TABS: 40.0000 mg | ORAL_TABLET | Freq: Every day | ORAL | 3 refills | Status: DC

## 2018-12-16 MED ORDER — FENOFIBRATE 145 MG PO TABS: 145.0000 mg | ORAL_TABLET | Freq: Every day | ORAL | 3 refills | Status: DC

## 2018-12-16 NOTE — Progress Notes (Signed)
Triglycerides VERY high. Needs to get back on fenofibrate and atorvastatin. I have sent in rx's for both today. Needs 4-6 week fasting lab visit for recheck FLP, AST, and ALT->dx mixed hyperlipidemia.

## 2018-12-16 NOTE — Addendum Note (Signed)
Addended by: Tammi Sou on: 12/16/2018 09:27 AM   Modules accepted: Orders

## 2019-02-02 ENCOUNTER — Ambulatory Visit: Payer: No Typology Code available for payment source

## 2019-03-02 ENCOUNTER — Other Ambulatory Visit: Payer: Self-pay

## 2019-03-02 ENCOUNTER — Ambulatory Visit (INDEPENDENT_AMBULATORY_CARE_PROVIDER_SITE_OTHER): Payer: No Typology Code available for payment source | Admitting: Family Medicine

## 2019-03-02 DIAGNOSIS — E782 Mixed hyperlipidemia: Secondary | ICD-10-CM

## 2019-03-02 LAB — LIPID PANEL
Cholesterol: 215 mg/dL — ABNORMAL HIGH (ref 0–200)
HDL: 33.6 mg/dL — ABNORMAL LOW (ref >39.00)
NonHDL: 181.2
Total CHOL/HDL Ratio: 6
Triglycerides: 305 mg/dL — ABNORMAL HIGH (ref 0.0–149.0)
VLDL: 61 mg/dL — ABNORMAL HIGH (ref 0.0–40.0)

## 2019-03-02 LAB — AST: AST: 19 U/L (ref 0–37)

## 2019-03-02 LAB — LDL CHOLESTEROL, DIRECT: Direct LDL: 127 mg/dL

## 2019-03-02 LAB — ALT: ALT: 25 U/L (ref 0–53)

## 2019-03-04 ENCOUNTER — Other Ambulatory Visit: Payer: Self-pay

## 2019-03-04 DIAGNOSIS — E782 Mixed hyperlipidemia: Secondary | ICD-10-CM

## 2019-03-04 MED ORDER — ATORVASTATIN CALCIUM 80 MG PO TABS: 80.0000 mg | ORAL_TABLET | Freq: Every day | ORAL | 2 refills | Status: DC

## 2019-05-28 ENCOUNTER — Other Ambulatory Visit: Payer: Self-pay | Admitting: Family Medicine

## 2019-05-28 NOTE — Telephone Encounter (Signed)
Patient should have enough until lab visit on 4/27.

## 2019-06-01 ENCOUNTER — Ambulatory Visit (INDEPENDENT_AMBULATORY_CARE_PROVIDER_SITE_OTHER): Payer: No Typology Code available for payment source | Admitting: Family Medicine

## 2019-06-01 ENCOUNTER — Other Ambulatory Visit: Payer: Self-pay

## 2019-06-01 DIAGNOSIS — E782 Mixed hyperlipidemia: Secondary | ICD-10-CM | POA: Diagnosis not present

## 2019-06-01 LAB — LIPID PANEL
Cholesterol: 193 mg/dL (ref 0–200)
HDL: 35.1 mg/dL — ABNORMAL LOW (ref >39.00)
NonHDL: 157.64
Total CHOL/HDL Ratio: 5
Triglycerides: 217 mg/dL — ABNORMAL HIGH (ref 0.0–149.0)
VLDL: 43.4 mg/dL — ABNORMAL HIGH (ref 0.0–40.0)

## 2019-06-01 LAB — LDL CHOLESTEROL, DIRECT: Direct LDL: 114 mg/dL

## 2019-06-03 ENCOUNTER — Other Ambulatory Visit: Payer: Self-pay

## 2019-06-07 ENCOUNTER — Ambulatory Visit: Payer: No Typology Code available for payment source | Admitting: Family Medicine

## 2019-06-07 ENCOUNTER — Encounter: Payer: Self-pay | Admitting: Family Medicine

## 2019-06-07 ENCOUNTER — Other Ambulatory Visit: Payer: Self-pay

## 2019-06-07 VITALS — BP 126/78 | HR 66 | Temp 98.4°F | Resp 16 | Ht 67.0 in | Wt 188.2 lb

## 2019-06-07 DIAGNOSIS — D229 Melanocytic nevi, unspecified: Secondary | ICD-10-CM

## 2019-06-07 DIAGNOSIS — R200 Anesthesia of skin: Secondary | ICD-10-CM

## 2019-06-07 DIAGNOSIS — E781 Pure hyperglyceridemia: Secondary | ICD-10-CM | POA: Diagnosis not present

## 2019-06-07 MED ORDER — ICOSAPENT ETHYL 1 G PO CAPS: 2.0000 g | ORAL_CAPSULE | Freq: Two times a day (BID) | ORAL | 2 refills | Status: DC

## 2019-06-07 NOTE — Progress Notes (Signed)
OFFICE VISIT  06/07/2019   CC:  Chief Complaint  Patient presents with  . Follow-up    RCI, pt is fasting. Would like to discuss recent results from 4/27   HPI:    Patient is a 44 y.o. Caucasian male who presents for 6 mo f/u severe hypertriglyceridemia, IFG, hepatic steatosis. Discussed recent lab work from 06/01/19 in detail with patient today. Tolerating atorva and fenofibrate, compliant. Diet ok during the week, weekends not so much.  No exercise--he is pretty active. 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  Six mo hx of toes 4-5 going on L foot going numb briefly.  NO color change.  Takes shoe off and shifts position and back to normal.   Asks for referral to dermatologist, has mole on back that wife says "looks weird" and says it is darker and larger than it had been.   Past Medical History:  Diagnosis Date  . Acute pancreatitis    ? alc?   Severe hypertriglyceridemia (required plasmaphoresis 2017)  . Alcohol abuse    question of->pt denies but records from admission for pancreatitis 2017 state + "alcohol dependency".  . Chewing tobacco nicotine dependence   . Hepatic steatosis    LFTs up in past  . History of vitamin D deficiency   . Hyperlipemia, mixed   . IFG (impaired fasting glucose)    very mild 2017    History reviewed. No pertinent surgical history.  Outpatient Medications Prior to Visit  Medication Sig Dispense Refill  . atorvastatin (LIPITOR) 80 MG tablet TAKE 1 TABLET BY MOUTH EVERY DAY 90 tablet 0  . fenofibrate (TRICOR) 145 MG tablet Take 1 tablet (145 mg total) by mouth daily. 90 tablet 3  . meloxicam (MOBIC) 7.5 MG tablet Take 7.5 mg by mouth daily.     No facility-administered medications prior to visit.    No Known Allergies  ROS As per HPI  PE: Blood pressure 126/78, pulse 66, temperature 98.4 F (36.9 C), temperature source Temporal, resp. rate 16, height 5\' 7"  (1.702 m), weight 188 lb 3.2 oz (85.4 kg),  SpO2 99 %. Gen: Alert, well appearing.  Patient is oriented to person, place, time, and situation. AFFECT: pleasant, lucid thought and speech. FEET: no deformity, no swelling, no tenderness. Back: mid back with 1 cm round nevus, irreg borders, light brown periphery with dark brown center.   LABS:  Lab Results  Component Value Date   TSH 2.16 12/15/2018   Lab Results  Component Value Date   WBC 4.5 12/15/2018   HGB 15.1 12/15/2018   HCT 45.8 12/15/2018   MCV 92.7 12/15/2018   PLT 170.0 12/15/2018   Lab Results  Component Value Date   CREATININE 1.04 12/15/2018   BUN 11 12/15/2018   NA 137 12/15/2018   K 4.4 12/15/2018   CL 101 12/15/2018   CO2 28 12/15/2018   Lab Results  Component Value Date   ALT 25 03/02/2019   AST 19 03/02/2019   ALKPHOS 43 12/15/2018   BILITOT 0.6 12/15/2018   Lab Results  Component Value Date   CHOL 193 06/01/2019   Lab Results  Component Value Date   HDL 35.10 (L) 06/01/2019   Lab Results  Component Value Date   LDLCALC 107 05/05/2015   Lab Results  Component Value Date   TRIG 217.0 (H) 06/01/2019   Lab Results  Component Value Date   CHOLHDL 5 06/01/2019   Lab Results  Component Value Date  HGBA1C 5.6 12/15/2018    IMPRESSION AND PLAN:  1) Hypertriglyceridemia, tolerating atorva 80mg  qd and fenofibrate 145 mg qd. 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  Hx of pancreatitis from hypertrigs and required plasmaphoresis. Add vascepa 2g bid, goal trigs <150. Continue atorva and fenobrate at current dosing. FLP and hepatic panel in 2 mo.  2) L toes numbness briefly: suspect brief peripheral nerve compression. Reassured. Signs/symptoms to call or return for were reviewed and pt expressed understanding.  3) Atypical nevus on his back, also with multiple typical-appearing pigmented skin lesions. Refer to derm.  Office f/u 6 mo for CPE  An After Visit Summary was printed and given to the patient.  FOLLOW UP: Return in  about 6 months (around 12/08/2019) for annual CPE (fasting); also 2 mo fasting lab appt.  Signed:  Crissie Sickles, MD           06/07/2019

## 2019-06-08 ENCOUNTER — Telehealth: Payer: Self-pay | Admitting: Dermatology

## 2019-06-08 ENCOUNTER — Telehealth: Payer: Self-pay

## 2019-06-08 NOTE — Telephone Encounter (Signed)
PA sent via covermymed on 06/08/19   Key: [id number]   Medication: VASCEPA 1GM CAP   Dx: [id number]   Per Dr. Anitra Lauth pt has tried but not failed, Atorvastatin 80mg  and Fenofibrate 145mg    Waiting for response.

## 2019-06-08 NOTE — Telephone Encounter (Signed)
PA approved, called pt's pharmacy to notify.

## 2019-06-08 NOTE — Telephone Encounter (Signed)
Patient is returning call about scheduling a referral appointment.

## 2019-08-10 ENCOUNTER — Ambulatory Visit: Payer: No Typology Code available for payment source

## 2019-08-26 ENCOUNTER — Ambulatory Visit: Payer: No Typology Code available for payment source | Admitting: Physician Assistant

## 2019-09-14 ENCOUNTER — Other Ambulatory Visit: Payer: Self-pay | Admitting: Family Medicine

## 2019-09-14 ENCOUNTER — Ambulatory Visit: Payer: No Typology Code available for payment source

## 2019-10-12 ENCOUNTER — Ambulatory Visit: Payer: No Typology Code available for payment source

## 2019-10-12 ENCOUNTER — Other Ambulatory Visit: Payer: Self-pay | Admitting: Family Medicine

## 2019-10-12 DIAGNOSIS — E781 Pure hyperglyceridemia: Secondary | ICD-10-CM

## 2019-10-15 ENCOUNTER — Other Ambulatory Visit: Payer: Self-pay | Admitting: Family Medicine

## 2019-10-15 NOTE — Telephone Encounter (Signed)
Pt should have enough until lab appt on 9/16.

## 2019-10-21 ENCOUNTER — Ambulatory Visit: Payer: No Typology Code available for payment source

## 2019-11-25 ENCOUNTER — Ambulatory Visit: Payer: No Typology Code available for payment source

## 2019-12-14 ENCOUNTER — Other Ambulatory Visit: Payer: Self-pay

## 2019-12-14 ENCOUNTER — Other Ambulatory Visit: Payer: Self-pay | Admitting: Family Medicine

## 2019-12-14 ENCOUNTER — Ambulatory Visit (INDEPENDENT_AMBULATORY_CARE_PROVIDER_SITE_OTHER): Payer: No Typology Code available for payment source

## 2019-12-14 DIAGNOSIS — E781 Pure hyperglyceridemia: Secondary | ICD-10-CM | POA: Diagnosis not present

## 2019-12-14 LAB — HEPATIC FUNCTION PANEL
ALT: 26 U/L (ref 0–53)
AST: 19 U/L (ref 0–37)
Albumin: 4.7 g/dL (ref 3.5–5.2)
Alkaline Phosphatase: 42 U/L (ref 39–117)
Bilirubin, Direct: 0.1 mg/dL (ref 0.0–0.3)
Total Bilirubin: 0.4 mg/dL (ref 0.2–1.2)
Total Protein: 6.8 g/dL (ref 6.0–8.3)

## 2019-12-14 LAB — LIPID PANEL
Cholesterol: 231 mg/dL — ABNORMAL HIGH (ref 0–200)
HDL: 30.6 mg/dL — ABNORMAL LOW (ref >39.00)
Total CHOL/HDL Ratio: 8
Triglycerides: 751 mg/dL — ABNORMAL HIGH (ref 0.0–149.0)

## 2019-12-14 LAB — LDL CHOLESTEROL, DIRECT: Direct LDL: 105 mg/dL

## 2019-12-16 ENCOUNTER — Other Ambulatory Visit: Payer: Self-pay | Admitting: Family Medicine

## 2019-12-16 MED ORDER — ICOSAPENT ETHYL 1 G PO CAPS
2.0000 g | ORAL_CAPSULE | Freq: Two times a day (BID) | ORAL | 0 refills | Status: DC
Start: 2019-12-16 — End: 2020-01-27

## 2019-12-24 ENCOUNTER — Encounter: Payer: Self-pay | Admitting: Family Medicine

## 2019-12-27 NOTE — Progress Notes (Signed)
Lvm for pt and/or wife to call back (lab results)

## 2019-12-29 ENCOUNTER — Telehealth: Payer: Self-pay

## 2019-12-29 DIAGNOSIS — E782 Mixed hyperlipidemia: Secondary | ICD-10-CM

## 2019-12-29 DIAGNOSIS — E781 Pure hyperglyceridemia: Secondary | ICD-10-CM

## 2019-12-29 NOTE — Telephone Encounter (Signed)
Spoke with pt regarding labs and instructions. Pt appt has been made and would like to proceed with referral

## 2019-12-29 NOTE — Telephone Encounter (Signed)
OK, will order referral. Did he say he was taking his cholesterol meds? (atorvastatin, fenofibrate, and vascepa).

## 2019-12-29 NOTE — Telephone Encounter (Signed)
-----   Message from Tammi Sou, MD sent at 12/24/2019  6:48 PM EST ----- Triglycerides very high: 751, compared to 217 last check 6 mo ago. I have not seen him in 6 mo and don't see any appt on the books. Ask him if he has consistently been taking his meds (atorvastatin, fenofibrate, and vascepa).  I highly recommend he see the lipid specialty clinic with [facility] cardiology. I can order referral but also recommend he make f/u appt with me in the meantime.  Let me know.

## 2019-12-29 NOTE — Telephone Encounter (Signed)
Sorry, he stated he has not been consistent with the vascepa.

## 2019-12-29 NOTE — Addendum Note (Signed)
Addended by: Tammi Sou on: 12/29/2019 08:36 AM   Modules accepted: Orders

## 2019-12-29 NOTE — Telephone Encounter (Signed)
Noted  

## 2020-01-13 ENCOUNTER — Other Ambulatory Visit: Payer: Self-pay | Admitting: Family Medicine

## 2020-01-14 ENCOUNTER — Encounter: Payer: Self-pay | Admitting: Internal Medicine

## 2020-01-15 ENCOUNTER — Other Ambulatory Visit: Payer: Self-pay | Admitting: Family Medicine

## 2020-01-20 ENCOUNTER — Ambulatory Visit: Payer: No Typology Code available for payment source | Admitting: Family Medicine

## 2020-01-26 ENCOUNTER — Other Ambulatory Visit: Payer: Self-pay

## 2020-01-27 ENCOUNTER — Encounter: Payer: Self-pay | Admitting: Family Medicine

## 2020-01-27 ENCOUNTER — Ambulatory Visit (INDEPENDENT_AMBULATORY_CARE_PROVIDER_SITE_OTHER): Payer: No Typology Code available for payment source | Admitting: Family Medicine

## 2020-01-27 VITALS — BP 116/74 | HR 70 | Temp 97.8°F | Resp 16 | Ht 67.0 in | Wt 194.2 lb

## 2020-01-27 DIAGNOSIS — E781 Pure hyperglyceridemia: Secondary | ICD-10-CM | POA: Diagnosis not present

## 2020-01-27 DIAGNOSIS — M7730 Calcaneal spur, unspecified foot: Secondary | ICD-10-CM

## 2020-01-27 DIAGNOSIS — E782 Mixed hyperlipidemia: Secondary | ICD-10-CM | POA: Diagnosis not present

## 2020-01-27 MED ORDER — ICOSAPENT ETHYL 1 G PO CAPS: 2.0000 g | ORAL_CAPSULE | Freq: Two times a day (BID) | ORAL | 3 refills | Status: DC

## 2020-01-27 MED ORDER — GEMFIBROZIL 600 MG PO TABS: 600.0000 mg | ORAL_TABLET | Freq: Two times a day (BID) | ORAL | 3 refills | Status: DC

## 2020-01-27 NOTE — Patient Instructions (Signed)
Stop atorvastatin and fenofibrate.  Start gemfibrozil I sent to pharmacy today.  Continue vascepa as you have been taking it.

## 2020-01-27 NOTE — Progress Notes (Signed)
Office Note 01/27/2020  CC:  Chief Complaint  Patient presents with  . Follow-up    RCI, pt is not fasting    HPI:  Alex Stevens is a 44 y.o. male who is here for f/u hypertriglyceridemia. A/P as of last visit: "1) Hypertriglyceridemia, tolerating atorva 80mg  qd and fenofibrate 145 mg qd. 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  Hx of pancreatitis from hypertrigs and required plasmaphoresis. Add vascepa 2g bid, goal trigs <150. Continue atorva and fenobrate at current dosing. FLP and hepatic panel in 2 mo.  2) L toes numbness briefly: suspect brief peripheral nerve compression. Reassured. Signs/symptoms to call or return for were reviewed and pt expressed understanding.  3) Atypical nevus on his back, also with multiple typical-appearing pigmented skin lesions. Refer to derm.  Office f/u 6 mo for CPE"  INTERIM HX: Feeling well, working VERY LONG hours as Futures trader, dealing with supply-chain havoc!  Compliant with atorva, vascepa, and fenofibrate. Diet continues to be a problem--not limiting anything "esp during football season". Beer with football gatherings but o/w minimal alcohol.  Going to the GYM several days a week through November. Cardio difficult b/c of bone spur pain on back of heel. (Dr. Gershon Mussel about 1 yr ago->inj helped but sx's return when active).   Repeat lipid panel 12/14/19-->trigs 751, HDL 31. I referred him to [facility] advanced lipid clinic at that time-->has appt in March 2022 with Dr. Debara Pickett.  ROS: no fevers, no CP, no SOB, no wheezing, no cough, no dizziness, no HAs, no rashes, no melena/hematochezia.  No polyuria or polydipsia.  No myalgias or arthralgias.  No focal weakness, paresthesias, or tremors.  No acute vision or hearing abnormalities. No n/v/d or abd pain.  No palpitations.     Past Medical History:  Diagnosis Date  . Acute pancreatitis    ? alc?   Severe hypertriglyceridemia (required plasmaphoresis 2017)  . Alcohol  abuse    question of->pt denies but records from admission for pancreatitis 2017 state + "alcohol dependency".  . Chewing tobacco nicotine dependence   . Hepatic steatosis    LFTs up in past  . History of vitamin D deficiency   . Hyperlipemia, mixed   . Hypertriglyceridemia    severe. +pancreatitis in the past  . IFG (impaired fasting glucose)    very mild 2017    History reviewed. No pertinent surgical history.  Family History  Problem Relation Age of Onset  . Hyperlipidemia Mother   . Hyperlipidemia Father   . Heart disease Paternal Grandmother     Social History   Socioeconomic History  . Marital status: Married    Spouse name: Not on file  . Number of children: Not on file  . Years of education: Not on file  . Highest education level: Not on file  Occupational History  . Not on file  Tobacco Use  . Smoking status: Never Smoker  . Smokeless tobacco: Current User    Types: Chew  Substance and Sexual Activity  . Alcohol use: Yes  . Drug use: No  . Sexual activity: Not on file  Other Topics Concern  . Not on file  Social History Narrative   Married, 3 daughters, 2 sons.   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   NO tob.   Alcohol->2-3 beers per week.   Denies hx of problem with alcohol, never used drugs.   Social Determinants of Health   Financial Resource Strain: Not on file  Food Insecurity: Not on file  Transportation Needs: Not on file  Physical Activity: Not on file  Stress: Not on file  Social Connections: Not on file  Intimate Partner Violence: Not on file    Outpatient Medications Prior to Visit  Medication Sig Dispense Refill  . atorvastatin (LIPITOR) 80 MG tablet TAKE 1 TABLET BY MOUTH EVERY DAY 90 tablet 1  . fenofibrate (TRICOR) 145 MG tablet TAKE 1 TABLET BY MOUTH EVERY DAY 30 tablet 0  . icosapent Ethyl (VASCEPA) 1 g capsule Take 2 capsules (2 g total) by mouth 2 (two) times daily. 120 capsule 0   No  facility-administered medications prior to visit.    No Known Allergies   PE; Vitals with BMI 01/27/2020 06/07/2019 12/07/2018  Height 5\' 7"  5\' 7"  5\' 7"   Weight 194 lbs 3 oz 188 lbs 3 oz 191 lbs  BMI 30.41 17.51 02.58  Systolic 527 782 423  Diastolic 74 78 80  Pulse 70 66 73    Gen: Alert, well appearing.  Patient is oriented to person, place, time, and situation. AFFECT: pleasant, lucid thought and speech.  No further exam today.  Pertinent labs:  Lab Results  Component Value Date   TSH 2.16 12/15/2018   Lab Results  Component Value Date   WBC 4.5 12/15/2018   HGB 15.1 12/15/2018   HCT 45.8 12/15/2018   MCV 92.7 12/15/2018   PLT 170.0 12/15/2018   Lab Results  Component Value Date   CREATININE 1.04 12/15/2018   BUN 11 12/15/2018   NA 137 12/15/2018   K 4.4 12/15/2018   CL 101 12/15/2018   CO2 28 12/15/2018   Lab Results  Component Value Date   ALT 26 12/14/2019   AST 19 12/14/2019   ALKPHOS 42 12/14/2019   BILITOT 0.4 12/14/2019   Lab Results  Component Value Date   CHOL 231 (H) 12/14/2019   Lab Results  Component Value Date   HDL 30.60 (L) 12/14/2019   Lab Results  Component Value Date   LDLCALC 107 05/05/2015   Lab Results  Component Value Date   TRIG (H) 12/14/2019    751.0 Triglyceride is over 400; calculations on Lipids are invalid.   Lab Results  Component Value Date   CHOLHDL 8 12/14/2019   Lab Results  Component Value Date   HGBA1C 5.6 12/15/2018   ASSESSMENT AND PLAN:   Hypertriglyceridemia, severe.  Compliance with atorva 80mg  qd, fenofibrate 160mg  qd, and vascepa 2mg  bid has been good. +Hx of pancreatitis, at least partially from his hypertrig+ ?ETOH contributing. Dietary noncompliance definitely an issue, beer not helping the situation any. Exercise sounds decent but heel pain limits good calorie-burning cardio.  PLAN:  Keep appt to start with lipid clinic 04/2020. Stop atorva and fenofibrate and start gemfibrozil 600mg  bid.   OK to continue vascepa 2mg  bid. Lab visit in 6 wks for FLP and hepatic panel. Refer to podiatry per pt request for his painful heel spur.  An After Visit Summary was printed and given to the patient.  FOLLOW UP:  Return in about 6 months (around 07/27/2020) for annual CPE (fasting).  Signed:  Crissie Sickles, MD           01/27/2020

## 2020-02-09 ENCOUNTER — Other Ambulatory Visit: Payer: Self-pay | Admitting: Family Medicine

## 2020-02-10 ENCOUNTER — Ambulatory Visit (INDEPENDENT_AMBULATORY_CARE_PROVIDER_SITE_OTHER): Payer: No Typology Code available for payment source

## 2020-02-10 ENCOUNTER — Other Ambulatory Visit: Payer: Self-pay

## 2020-02-10 ENCOUNTER — Ambulatory Visit (INDEPENDENT_AMBULATORY_CARE_PROVIDER_SITE_OTHER): Payer: No Typology Code available for payment source | Admitting: Podiatry

## 2020-02-10 DIAGNOSIS — M722 Plantar fascial fibromatosis: Secondary | ICD-10-CM

## 2020-02-10 DIAGNOSIS — M7662 Achilles tendinitis, left leg: Secondary | ICD-10-CM | POA: Diagnosis not present

## 2020-02-10 DIAGNOSIS — M7732 Calcaneal spur, left foot: Secondary | ICD-10-CM

## 2020-02-10 DIAGNOSIS — M79672 Pain in left foot: Secondary | ICD-10-CM

## 2020-02-10 DIAGNOSIS — G8929 Other chronic pain: Secondary | ICD-10-CM

## 2020-02-10 DIAGNOSIS — M21611 Bunion of right foot: Secondary | ICD-10-CM

## 2020-02-10 MED ORDER — MELOXICAM 15 MG PO TABS: 15.0000 mg | ORAL_TABLET | Freq: Every day | ORAL | 0 refills | Status: DC

## 2020-02-10 NOTE — Patient Instructions (Signed)
If was nice to meet you today. If you have any questions or any further concerns, please feel fee to give me a call. You can call our office at [phone] or please feel fee to send me a message through MyChart.   -- Look at getting Voltaren gel  --  Achilles Tendinitis  with Rehab Achilles tendinitis is a disorder of the Achilles tendon. The Achilles tendon connects the large calf muscles (Gastrocnemius and Soleus) to the heel bone (calcaneus). This tendon is sometimes called the heel cord. It is important for pushing-off and standing on your toes and is important for walking, running, or jumping. Tendinitis is often caused by overuse and repetitive microtrauma. SYMPTOMS  Pain, tenderness, swelling, warmth, and redness may occur over the Achilles tendon even at rest.  Pain with pushing off, or flexing or extending the ankle.  Pain that is worsened after or during activity. CAUSES   Overuse sometimes seen with rapid increase in exercise programs or in sports requiring running and jumping.  Poor physical conditioning (strength and flexibility or endurance).  Running sports, especially training running down hills.  Inadequate warm-up before practice or play or failure to stretch before participation.  Injury to the tendon. PREVENTION   Warm up and stretch before practice or competition.  Allow time for adequate rest and recovery between practices and competition.  Keep up conditioning.  Keep up ankle and leg flexibility.  Improve or keep muscle strength and endurance.  Improve cardiovascular fitness.  Use proper technique.  Use proper equipment (shoes, skates).  To help prevent recurrence, taping, protective strapping, or an adhesive bandage may be recommended for several weeks after healing is complete. PROGNOSIS   Recovery may take weeks to several months to heal.  Longer recovery is expected if symptoms have been prolonged.  Recovery is usually quicker if the  inflammation is due to a direct blow as compared with overuse or sudden strain. RELATED COMPLICATIONS   Healing time will be prolonged if the condition is not correctly treated. The injury must be given plenty of time to heal.  Symptoms can reoccur if activity is resumed too soon.  Untreated, tendinitis may increase the risk of tendon rupture requiring additional time for recovery and possibly surgery. TREATMENT   The first treatment consists of rest anti-inflammatory medication, and ice to relieve the pain.  Stretching and strengthening exercises after resolution of pain will likely help reduce the risk of recurrence. Referral to a physical therapist or athletic trainer for further evaluation and treatment may be helpful.  A walking boot or cast may be recommended to rest the Achilles tendon. This can help break the cycle of inflammation and microtrauma.  Arch supports (orthotics) may be prescribed or recommended by your caregiver as an adjunct to therapy and rest.  Surgery to remove the inflamed tendon lining or degenerated tendon tissue is rarely necessary and has shown less than predictable results. MEDICATION   Nonsteroidal anti-inflammatory medications, such as aspirin and ibuprofen, may be used for pain and inflammation relief. Do not take within 7 days before surgery. Take these as directed by your caregiver. Contact your caregiver immediately if any bleeding, stomach upset, or signs of allergic reaction occur. Other minor pain relievers, such as acetaminophen, may also be used.  Pain relievers may be prescribed as necessary by your caregiver. Do not take prescription pain medication for longer than 4 to 7 days. Use only as directed and only as much as you need.  Cortisone injections are rarely  indicated. Cortisone injections may weaken tendons and predispose to rupture. It is better to give the condition more time to heal than to use them. HEAT AND COLD  Cold is used to relieve  pain and reduce inflammation for acute and chronic Achilles tendinitis. Cold should be applied for 10 to 15 minutes every 2 to 3 hours for inflammation and pain and immediately after any activity that aggravates your symptoms. Use ice packs or an ice massage.  Heat may be used before performing stretching and strengthening activities prescribed by your caregiver. Use a heat pack or a warm soak. SEEK MEDICAL CARE IF:  Symptoms get worse or do not improve in 2 weeks despite treatment.  New, unexplained symptoms develop. Drugs used in treatment may produce side effects.  EXERCISES:  RANGE OF MOTION (ROM) AND STRETCHING EXERCISES - Achilles Tendinitis  These exercises may help you when beginning to rehabilitate your injury. Your symptoms may resolve with or without further involvement from your physician, physical therapist or athletic trainer. While completing these exercises, remember:   Restoring tissue flexibility helps normal motion to return to the joints. This allows healthier, less painful movement and activity.  An effective stretch should be held for at least 30 seconds.  A stretch should never be painful. You should only feel a gentle lengthening or release in the stretched tissue.  STRETCH  Gastroc, Standing   Place hands on wall.  Extend right / left leg, keeping the front knee somewhat bent.  Slightly point your toes inward on your back foot.  Keeping your right / left heel on the floor and your knee straight, shift your weight toward the wall, not allowing your back to arch.  You should feel a gentle stretch in the right / left calf. Hold this position for 10 seconds. Repeat 3 times. Complete this stretch 2 times per day.  STRETCH  Soleus, Standing   Place hands on wall.  Extend right / left leg, keeping the other knee somewhat bent.  Slightly point your toes inward on your back foot.  Keep your right / left heel on the floor, bend your back knee, and slightly  shift your weight over the back leg so that you feel a gentle stretch deep in your back calf.  Hold this position for 10 seconds. Repeat 3 times. Complete this stretch 2 times per day.  STRETCH  Gastrocsoleus, Standing  Note: This exercise can place a lot of stress on your foot and ankle. Please complete this exercise only if specifically instructed by your caregiver.   Place the ball of your right / left foot on a step, keeping your other foot firmly on the same step.  Hold on to the wall or a rail for balance.  Slowly lift your other foot, allowing your body weight to press your heel down over the edge of the step.  You should feel a stretch in your right / left calf.  Hold this position for 10 seconds.  Repeat this exercise with a slight bend in your knee. Repeat 3 times. Complete this stretch 2 times per day.   STRENGTHENING EXERCISES - Achilles Tendinitis These exercises may help you when beginning to rehabilitate your injury. They may resolve your symptoms with or without further involvement from your physician, physical therapist or athletic trainer. While completing these exercises, remember:   Muscles can gain both the endurance and the strength needed for everyday activities through controlled exercises.  Complete these exercises as instructed by your physician,  physical therapist or athletic trainer. Progress the resistance and repetitions only as guided.  You may experience muscle soreness or fatigue, but the pain or discomfort you are trying to eliminate should never worsen during these exercises. If this pain does worsen, stop and make certain you are following the directions exactly. If the pain is still present after adjustments, discontinue the exercise until you can discuss the trouble with your clinician.  STRENGTH - Plantar-flexors   Sit with your right / left leg extended. Holding onto both ends of a rubber exercise band/tubing, loop it around the ball of your  foot. Keep a slight tension in the band.  Slowly push your toes away from you, pointing them downward.  Hold this position for 10 seconds. Return slowly, controlling the tension in the band/tubing. Repeat 3 times. Complete this exercise 2 times per day.   STRENGTH - Plantar-flexors   Stand with your feet shoulder width apart. Steady yourself with a wall or table using as little support as needed.  Keeping your weight evenly spread over the width of your feet, rise up on your toes.*  Hold this position for 10 seconds. Repeat 3 times. Complete this exercise 2 times per day.  *If this is too easy, shift your weight toward your right / left leg until you feel challenged. Ultimately, you may be asked to do this exercise with your right / left foot only.  STRENGTH  Plantar-flexors, Eccentric  Note: This exercise can place a lot of stress on your foot and ankle. Please complete this exercise only if specifically instructed by your caregiver.   Place the balls of your feet on a step. With your hands, use only enough support from a wall or rail to keep your balance.  Keep your knees straight and rise up on your toes.  Slowly shift your weight entirely to your right / left toes and pick up your opposite foot. Gently and with controlled movement, lower your weight through your right / left foot so that your heel drops below the level of the step. You will feel a slight stretch in the back of your calf at the end position.  Use the healthy leg to help rise up onto the balls of both feet, then lower weight only on the right / left leg again. Build up to 15 repetitions. Then progress to 3 consecutive sets of 15 repetitions.*  After completing the above exercise, complete the same exercise with a slight knee bend (about 30 degrees). Again, build up to 15 repetitions. Then progress to 3 consecutive sets of 15 repetitions.* Perform this exercise 2 times per day.  *When you easily complete 3 sets of 15,  your physician, physical therapist or athletic trainer may advise you to add resistance by wearing a backpack filled with additional weight.  STRENGTH - Plantar Flexors, Seated   Sit on a chair that allows your feet to rest flat on the ground. If necessary, sit at the edge of the chair.  Keeping your toes firmly on the ground, lift your right / left heel as far as you can without increasing any discomfort in your ankle. Repeat 3 times. Complete this exercise 2 times a day.

## 2020-02-15 NOTE — Progress Notes (Signed)
Subjective:   Patient ID: Alex Stevens, male   DOB: 45 y.o.   MRN: [id number]   HPI 45 year old male presents the office today for concerns of pain to the back of his left heel which has been ongoing for the last 2 years.  He states is most painful with walking up a hill he has had altered his gait pattern to accommodate this.  He previously did see Dr. Gershon Mussel.  He has shot into the back of the heel and was placed into a boot which did help some but the pain came back. At that time he was told that the heel spur had fractured.   Also secondary concerns of a possible bunion on the right foot.  He states that on Thanksgiving he denies discomfort pointing to the first MPJ.  No injury to the site.  Some mild swelling but no significant redness or warmth he reports.  He has changed shoes recently.  No other concerns today.   Review of Systems  All other systems reviewed and are negative.  Past Medical History:  Diagnosis Date  . Acute pancreatitis    ? alc?   Severe hypertriglyceridemia (required plasmaphoresis 2017)  . Alcohol abuse    question of->pt denies but records from admission for pancreatitis 2017 state + "alcohol dependency".  . Chewing tobacco nicotine dependence   . Hepatic steatosis    LFTs up in past  . History of vitamin D deficiency   . Hyperlipemia, mixed   . Hypertriglyceridemia    severe. +pancreatitis in the past  . IFG (impaired fasting glucose)    very mild 2017    No past surgical history on file.   Current Outpatient Medications:  .  meloxicam (MOBIC) 15 MG tablet, Take 1 tablet (15 mg total) by mouth daily., Disp: 30 tablet, Rfl: 0 .  gemfibrozil (LOPID) 600 MG tablet, Take 1 tablet (600 mg total) by mouth 2 (two) times daily before a meal., Disp: 60 tablet, Rfl: 3 .  icosapent Ethyl (VASCEPA) 1 g capsule, Take 2 capsules (2 g total) by mouth 2 (two) times daily., Disp: 120 capsule, Rfl: 3  No Known Allergies        Objective:  Physical Exam  General: AAO  x3, NAD  Dermatological: Skin is warm, dry and supple bilateral. There are no open sores, no preulcerative lesions, no rash or signs of infection present.  Vascular: Dorsalis Pedis artery and Posterior Tibial artery pedal pulses are 2/4 bilateral with immedate capillary fill time.  There is no pain with calf compression, swelling, warmth, erythema.   Neruologic: Grossly intact via light touch bilateral. Negative tinel sign.   Musculoskeletal: On the right side there is moderate bunion present and there is decreased range of motion of the first MTPJ. There is no edema, erythema.  No significant pain to this area today.  The left side there is tenderness to palpation on the posterior calcaneus on area of prominent bone spur and along the distal portion of the Achilles tendon.  The Achilles tendon appears to be intact.  There is no pain with lateral compression of calcaneus.  No pain in the plantar calcaneus.  No other areas of discomfort.  MMT 5/5.  Equinus is present.  Gait: Unassisted, Nonantalgic.       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  Bunion is present the right foot with arthritic changes present in the first digit.  On the left side there is posterior calcaneal spurring present.  There is no evidence of acute fracture bilaterally. -The right foot is currently asymptomatic.  Discussed she modifications, offloading pads.  Can use Voltaren gel as needed.  -For the left foot we discussed with conservative as well as surgical treatment options.  After discussion will continue with conservative treatment.  Dispensed heel lifts as well as a gel Achilles sleeve.  Discussed the modifications and arch supports.  We will also schedule him  for EPAT.  If symptoms continue discussed surgical intervention we discussed posterior heel spur resection with detachment, reattachment Achilles tendon as well as gastrocnemius recession.  Trula Slade DPM

## 2020-02-25 ENCOUNTER — Ambulatory Visit (INDEPENDENT_AMBULATORY_CARE_PROVIDER_SITE_OTHER): Payer: Self-pay | Admitting: *Deleted

## 2020-02-25 ENCOUNTER — Other Ambulatory Visit: Payer: Self-pay

## 2020-02-25 DIAGNOSIS — B351 Tinea unguium: Secondary | ICD-10-CM

## 2020-02-25 DIAGNOSIS — M7662 Achilles tendinitis, left leg: Secondary | ICD-10-CM

## 2020-02-25 NOTE — Patient Instructions (Signed)

## 2020-02-25 NOTE — Progress Notes (Signed)
Patient presents for the 1st EPAT treatment today with complaint of posterior heel pain left. Diagnosed with achilles tendonitis by Dr. Jacqualyn Posey. This has been ongoing for several months. The patient has tried ice, stretching, NSAIDS and supportive shoe gear with no long term relief.   Most of the pain is located very pinpoint posterior heel.  ESWT administered and tolerated well.Treatment settings initiated at:   Energy: 15  Ended treatment session today with 3000 shocks at the following settings:   Energy: 15  Frequency: 6.0  Joules: 14.72   Reviewed post EPAT instructions. Advised to avoid ice and NSAIDs throughout the treatment process and to utilize boot or supportive shoes for at least the next 3 days.  He is currently using the heel lifts Dr. Jacqualyn Posey dispensed. He used to have a walking boot from another office previously treated for this same problem, however, his wife disposed of it last summer. Informed him if pain was not controlled with Tylenol and modified activity levels, I could dispense him a boot for immobilization.   Follow up for 2nd treatment in 1 week.

## 2020-03-03 ENCOUNTER — Ambulatory Visit (INDEPENDENT_AMBULATORY_CARE_PROVIDER_SITE_OTHER): Payer: Self-pay | Admitting: *Deleted

## 2020-03-03 ENCOUNTER — Other Ambulatory Visit: Payer: Self-pay

## 2020-03-03 DIAGNOSIS — M7732 Calcaneal spur, left foot: Secondary | ICD-10-CM

## 2020-03-03 DIAGNOSIS — B351 Tinea unguium: Secondary | ICD-10-CM

## 2020-03-03 DIAGNOSIS — M7662 Achilles tendinitis, left leg: Secondary | ICD-10-CM

## 2020-03-03 NOTE — Progress Notes (Signed)
Patient presents for the 2nd EPAT treatment today with complaint of posterior heel pain left. Diagnosed with achilles tendonitis by Dr. Jacqualyn Posey. This has been ongoing for several months. The patient has tried ice, stretching, NSAIDS and supportive shoe gear with no long term relief.   Most of the pain is located very pinpoint posterior heel. He states that he really had no pain over the last week after his first treatment and felt a lot better than it has in awhile.  ESWT administered and tolerated well.Treatment settings initiated at:   Energy: 20  Ended treatment session today with 3000 shocks at the following settings:   Energy: 20  Frequency: 5.0  Joules: 19.62   Reviewed post EPAT instructions. Advised to avoid ice and NSAIDs throughout the treatment process and to utilize boot or supportive shoes for at least the next 3 days.  He is currently using the heel lifts Dr. Jacqualyn Posey dispensed. He used to have a walking boot from another office previously treated for this same problem, however, his wife disposed of it last summer. Informed him if pain was not controlled with Tylenol and modified activity levels, I could dispense him a boot for immobilization.   Follow up for 3rd treatment in 1 week.

## 2020-03-09 ENCOUNTER — Other Ambulatory Visit: Payer: Self-pay

## 2020-03-09 ENCOUNTER — Ambulatory Visit (INDEPENDENT_AMBULATORY_CARE_PROVIDER_SITE_OTHER): Payer: No Typology Code available for payment source

## 2020-03-09 DIAGNOSIS — E782 Mixed hyperlipidemia: Secondary | ICD-10-CM

## 2020-03-09 DIAGNOSIS — E781 Pure hyperglyceridemia: Secondary | ICD-10-CM

## 2020-03-09 LAB — HEPATIC FUNCTION PANEL
ALT: 43 U/L (ref 0–53)
AST: 23 U/L (ref 0–37)
Albumin: 5 g/dL (ref 3.5–5.2)
Alkaline Phosphatase: 47 U/L (ref 39–117)
Bilirubin, Direct: 0.1 mg/dL (ref 0.0–0.3)
Total Bilirubin: 0.6 mg/dL (ref 0.2–1.2)
Total Protein: 7.6 g/dL (ref 6.0–8.3)

## 2020-03-09 LAB — LIPID PANEL
Cholesterol: 316 mg/dL — ABNORMAL HIGH (ref 0–200)
HDL: 31.3 mg/dL — ABNORMAL LOW (ref >39.00)
Total CHOL/HDL Ratio: 10
Triglycerides: 813 mg/dL — ABNORMAL HIGH (ref 0.0–149.0)

## 2020-03-09 LAB — LDL CHOLESTEROL, DIRECT: Direct LDL: 117 mg/dL

## 2020-03-10 ENCOUNTER — Ambulatory Visit (INDEPENDENT_AMBULATORY_CARE_PROVIDER_SITE_OTHER): Payer: No Typology Code available for payment source | Admitting: *Deleted

## 2020-03-10 DIAGNOSIS — B351 Tinea unguium: Secondary | ICD-10-CM

## 2020-03-10 DIAGNOSIS — M7662 Achilles tendinitis, left leg: Secondary | ICD-10-CM

## 2020-03-10 DIAGNOSIS — M7732 Calcaneal spur, left foot: Secondary | ICD-10-CM

## 2020-03-10 NOTE — Progress Notes (Signed)
Patient presents for the 3rd EPAT treatment today with complaint of posterior heel pain left. Diagnosed with achilles tendonitis by Dr. Jacqualyn Posey. This has been ongoing for several months. The patient has tried ice, stretching, NSAIDS and supportive shoe gear with no long term relief.   Most of the pain is located very pinpoint posterior heel. He says he is doing much better, barely any pain.  ESWT administered and tolerated well.Treatment settings initiated at:   Energy: 25  Ended treatment session today with 3000 shocks at the following settings:   Energy: 25  Frequency: 4.0  Joules: 24.52   Reviewed post EPAT instructions. Advised to avoid ice and NSAIDs throughout the treatment process and to utilize boot or supportive shoes for at least the next 3 days.  He is currently using the heel lifts Dr. Jacqualyn Posey dispensed. He used to have a walking boot from another office previously treated for this same problem, however, his wife disposed of it last summer. Informed him if pain was not controlled with Tylenol and modified activity levels, I could dispense him a boot for immobilization.   Follow up for 4th treatment in 2 weeks.

## 2020-03-27 ENCOUNTER — Ambulatory Visit (INDEPENDENT_AMBULATORY_CARE_PROVIDER_SITE_OTHER): Payer: No Typology Code available for payment source

## 2020-03-27 ENCOUNTER — Other Ambulatory Visit: Payer: Self-pay

## 2020-03-27 DIAGNOSIS — B351 Tinea unguium: Secondary | ICD-10-CM

## 2020-03-27 DIAGNOSIS — M7662 Achilles tendinitis, left leg: Secondary | ICD-10-CM

## 2020-03-27 NOTE — Progress Notes (Signed)
Patient presents for the 4th EPAT treatment today with complaint of posterior heel pain left. Diagnosed with achilles tendonitis by Dr. Jacqualyn Posey. This has been ongoing for several months. The patient has tried ice, stretching, NSAIDS and supportive shoe gear with no long term relief.   Most of the pain is located very pinpoint posterior heel. He says he is doing much better, barely any pain.  ESWT administered and tolerated well.Treatment settings initiated at:   Energy: 30  Ended treatment session today with 3000 shocks at the following settings:   Energy: 30  Frequency: 4.0  Joules: 29.43   Reviewed post EPAT instructions. Advised to avoid ice and NSAIDs throughout the treatment process and to utilize boot or supportive shoes for at least the next 3 days.  He is currently using the heel lifts Dr. Jacqualyn Posey dispensed. He used to have a walking boot from another office previously treated for this same problem, however, his wife disposed of it last summer. Informed him if pain was not controlled with Tylenol and modified activity levels, I could dispense him a boot for immobilization.   Follow up for further instruction with Dr. Jacqualyn Posey in 6-8 weeks.

## 2020-04-12 ENCOUNTER — Ambulatory Visit: Payer: No Typology Code available for payment source | Admitting: Internal Medicine

## 2020-04-25 ENCOUNTER — Other Ambulatory Visit: Payer: Self-pay | Admitting: Family Medicine

## 2020-05-23 ENCOUNTER — Ambulatory Visit: Payer: No Typology Code available for payment source | Admitting: Podiatry

## 2020-06-06 ENCOUNTER — Ambulatory Visit: Payer: No Typology Code available for payment source | Admitting: Podiatry

## 2020-07-05 ENCOUNTER — Other Ambulatory Visit: Payer: Self-pay

## 2020-07-05 ENCOUNTER — Encounter: Payer: Self-pay | Admitting: Internal Medicine

## 2020-07-05 ENCOUNTER — Ambulatory Visit: Payer: No Typology Code available for payment source | Admitting: Internal Medicine

## 2020-07-05 VITALS — BP 122/86 | HR 74 | Ht 67.0 in | Wt 186.8 lb

## 2020-07-05 DIAGNOSIS — Z8719 Personal history of other diseases of the digestive system: Secondary | ICD-10-CM | POA: Diagnosis not present

## 2020-07-05 DIAGNOSIS — E783 Hyperchylomicronemia: Secondary | ICD-10-CM | POA: Diagnosis not present

## 2020-07-05 DIAGNOSIS — R7303 Prediabetes: Secondary | ICD-10-CM

## 2020-07-05 HISTORY — DX: Prediabetes: R73.03

## 2020-07-05 MED ORDER — FENOFIBRATE 145 MG PO TABS: 145.0000 mg | ORAL_TABLET | Freq: Every day | ORAL | 3 refills | Status: DC

## 2020-07-05 MED ORDER — ATORVASTATIN CALCIUM 40 MG PO TABS: 40.0000 mg | ORAL_TABLET | Freq: Every day | ORAL | 3 refills | Status: DC

## 2020-07-05 NOTE — Progress Notes (Deleted)
OFFICE VISIT  07/05/2020  CC: No chief complaint on file.  HPI:    Patient is a 45 y.o. male who presents for sutures removal from *** finger. Sutures placed by urgent care on 06/26/20.  I last saw him for severe hypertriglyceridemia 01/2020, pt had initial consult appt with advanced lipid clinic set at that time for March 2022.   Past Medical History:  Diagnosis Date  . Acute pancreatitis    ? alc?   Severe hypertriglyceridemia (required plasmaphoresis 2017)  . Alcohol abuse    question of->pt denies but records from admission for pancreatitis 2017 state + "alcohol dependency".  . Chewing tobacco nicotine dependence   . Hepatic steatosis    LFTs up in past  . History of vitamin D deficiency   . Hyperlipemia, mixed   . Hypertriglyceridemia    severe. +pancreatitis in the past  . IFG (impaired fasting glucose)    very mild 2017    No past surgical history on file.  Outpatient Medications Prior to Visit  Medication Sig Dispense Refill  . atorvastatin (LIPITOR) 40 MG tablet Take 1 tablet (40 mg total) by mouth daily. 90 tablet 3  . fenofibrate (TRICOR) 145 MG tablet Take 1 tablet (145 mg total) by mouth daily. 90 tablet 3  . icosapent Ethyl (VASCEPA) 1 g capsule Take 2 capsules (2 g total) by mouth 2 (two) times daily. 120 capsule 3   No facility-administered medications prior to visit.    No Known Allergies  ROS As per HPI  PE: Vitals with BMI 07/05/2020 01/27/2020 06/07/2019  Height 5\' 7"  5\' 7"  5\' 7"   Weight 186 lbs 13 oz 194 lbs 3 oz 188 lbs 3 oz  BMI 29.25 91.50 56.97  Systolic 948 016 553  Diastolic 86 74 78  Pulse 74 70 66     ***  LABS:    Chemistry      Component Value Date/Time   NA 137 12/15/2018 0926   NA 142 05/05/2015 0000   K 4.4 12/15/2018 0926   CL 101 12/15/2018 0926   CO2 28 12/15/2018 0926   BUN 11 12/15/2018 0926   BUN 19 05/05/2015 0000   CREATININE 1.04 12/15/2018 0926   GLU 104 05/05/2015 0000      Component Value Date/Time    CALCIUM 9.3 12/15/2018 0926   ALKPHOS 47 03/09/2020 0852   AST 23 03/09/2020 0852   ALT 43 03/09/2020 0852   BILITOT 0.6 03/09/2020 0852     Lab Results  Component Value Date   CHOL 316 (H) 03/09/2020   HDL 31.30 (L) 03/09/2020   LDLCALC 107 05/05/2015   LDLDIRECT 117.0 03/09/2020   TRIG (H) 03/09/2020    813.0 Triglyceride is over 400; calculations on Lipids are invalid.   CHOLHDL 10 03/09/2020   Lab Results  Component Value Date   LIPASE 86 (H) 03/07/2015   IMPRESSION AND PLAN:  No problem-specific Assessment & Plan notes found for this encounter.   An After Visit Summary was printed and given to the patient.  FOLLOW UP: No follow-ups on file.  Signed:  Crissie Sickles, MD           07/05/2020

## 2020-07-05 NOTE — Patient Instructions (Signed)
Medication Instructions:  START fenofibrate 145mg  daily START atorvastatin 40mg  daily  *If you need a refill on your cardiac medications before your next appointment, please call your pharmacy*   Testing/Procedures: Genetic Test -- results should be available in about 3-4 weeks   Follow-Up: At [facility], you and your health needs are our priority.  As part of our continuing mission to provide you with exceptional heart care, we have created designated Provider Care Teams.  These Care Teams include your primary Cardiologist (physician) and Advanced Practice Providers (APPs -  Physician Assistants and Nurse Practitioners) who all work together to provide you with the care you need, when you need it.  We recommend signing up for the patient portal called "MyChart".  Sign up information is provided on this After Visit Summary.  MyChart is used to connect with patients for Virtual Visits (Telemedicine).  Patients are able to view lab/test results, encounter notes, upcoming appointments, etc.  Non-urgent messages can be sent to your provider as well.   To learn more about what you can do with MyChart, go to [email or web address].    Your next appointment:   3-4 months with Dr. Debara Pickett - in-person  - early morning appointment  - please come to appointment fasting (no food/drink after midnight - water is OK)  Other Instructions  Dr. Debara Pickett will pass your name along to [facility]'s clinical research nurses to contact you about clinical trials (CORE)

## 2020-07-06 ENCOUNTER — Telehealth: Payer: Self-pay | Admitting: Internal Medicine

## 2020-07-06 ENCOUNTER — Ambulatory Visit: Payer: No Typology Code available for payment source | Admitting: Family Medicine

## 2020-07-06 DIAGNOSIS — Z0289 Encounter for other administrative examinations: Secondary | ICD-10-CM

## 2020-07-06 NOTE — Telephone Encounter (Signed)
Genetic test for familial hypertriglyceridemia ordered ([facility]) Cheek swab completed in office Specimen and necessary paperwork mailed.

## 2020-07-07 NOTE — Progress Notes (Signed)
LIPID CLINIC CONSULT NOTE  Chief Complaint:  Manage dyslipidemia  Primary Care Physician: Alex Sou, MD  Primary Cardiologist:  None  HPI:  Alex Stevens is a 45 y.o. male who is being seen today for the evaluation of lipidemia at the request of McGowen, Adrian Blackwater, MD.  This is a pleasant 45 year old male who is kindly referred for evaluation management of dyslipidemia.  He has a history of high triglycerides in the past and has had pancreatitis.  He thought it was related to high triglycerides and apparently had plasmapheresis at [facility].  He also had history of alcohol use in the past however says that that has improved.  Other medical problems include impaired fasting glucose and hepatic steatosis.  There is a history of dyslipidemia in both parents,, but namely his father who also had high triglycerides.  Most recent labs indicated total cholesterol 316, triglycerides 813, HDL 31 and a direct LDL was measured at 117.  Liver enzymes were normal.  PMHx:  Past Medical History:  Diagnosis Date  . Acute pancreatitis    ? alc?   Severe hypertriglyceridemia (required plasmaphoresis 2017)  . Alcohol abuse    question of->pt denies but records from admission for pancreatitis 2017 state + "alcohol dependency".  . Chewing tobacco nicotine dependence   . Hepatic steatosis    LFTs up in past  . History of vitamin D deficiency   . Hyperlipemia, mixed   . Hypertriglyceridemia    severe. +pancreatitis in the past  . IFG (impaired fasting glucose)    very mild 2017    No past surgical history on file.  FAMHx:  Family History  Problem Relation Age of Onset  . Hyperlipidemia Mother   . Hyperlipidemia Father   . Heart disease Paternal Grandmother     SOCHx:   reports that he has never smoked. His smokeless tobacco use includes chew. He reports current alcohol use. He reports that he does not use drugs.  ALLERGIES:  No Known Allergies  ROS: Pertinent items noted in HPI and  remainder of comprehensive ROS otherwise negative.  HOME MEDS: Current Outpatient Medications on File Prior to Visit  Medication Sig Dispense Refill  . icosapent Ethyl (VASCEPA) 1 g capsule Take 2 capsules (2 g total) by mouth 2 (two) times daily. 120 capsule 3   No current facility-administered medications on file prior to visit.    LABS/IMAGING: No results found for this or any previous visit (from the past 48 hour(s)). No results found.  LIPID PANEL:    Component Value Date/Time   CHOL 316 (H) 03/09/2020 0852   TRIG (H) 03/09/2020 0852    813.0 Triglyceride is over 400; calculations on Lipids are invalid.   HDL 31.30 (L) 03/09/2020 0852   CHOLHDL 10 03/09/2020 0852   VLDL 43.4 (H) 06/01/2019 0841   LDLCALC 107 05/05/2015 0000   LDLDIRECT 117.0 03/09/2020 0852    WEIGHTS: Wt Readings from Last 3 Encounters:  07/05/20 186 lb 12.8 oz (84.7 kg)  01/27/20 194 lb 3.2 oz (88.1 kg)  06/07/19 188 lb 3.2 oz (85.4 kg)    VITALS: BP 122/86 (BP Location: Left Arm, Patient Position: Sitting, Cuff Size: Normal)   Pulse 74   Ht 5\' 7"  (1.702 m)   Wt 186 lb 12.8 oz (84.7 kg)   BMI 29.26 kg/m   EXAM: General appearance: alert and no distress Neck: no carotid bruit, no JVD and thyroid not enlarged, symmetric, no tenderness/mass/nodules Lungs: clear to auscultation bilaterally  Heart: regular rate and rhythm, S1, S2 normal, no murmur, click, rub or gallop Abdomen: soft, non-tender; bowel sounds normal; no masses,  no organomegaly Extremities: extremities normal, atraumatic, no cyanosis or edema Pulses: 2+ and symmetric Skin: Skin color, texture, turgor normal. No rashes or lesions Neurologic: Grossly normal Psych: Pleasant  EKG: Deferred   ASSESSMENT: 1. 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 History of pancreatitis 3. History of alcohol use 4. Hepatic steatosis  PLAN: 1.   Alex Stevens has very high triglycerides and a history of pancreatitis for which he received  plasmapheresis.  Most recently his triglycerides were well over 800.  Direct LDL is still elevated at 117.  Although he does have some hepatic steatosis, his liver enzymes have been normal.  He was previously on statin, fenofibrate and Vascepa however more recently was switched to gemfibrozil.  For some reason his statin was discontinued.  I think he was confused about the medicines.  It would be beneficial for him to go back on statin therapy and because there is less interaction between statins and fenofibrate versus gemfibrozil, I would recommend he go back to the fenofibrate 145 mg daily.  He should continue Vascepa 2 g twice daily.  Moreover with this elevation in his triglycerides, he is an excellent candidate for the core trial, especially given his history of pancreatitis in the past.  I will refer him to our research coordinators to see if we could enroll him in the study.  He is interested.  Thanks for the kind referral.  Alex Casino, MD, FACC, Alex Stevens Director of the Advanced Lipid Disorders &  Cardiovascular Risk Reduction Clinic Diplomate of the American Board of Clinical Lipidology Attending Cardiologist  Direct Dial: [phone]  Fax: [phone]  Website:  [email or web address] 07/07/2020, 4:02 PM

## 2020-07-17 ENCOUNTER — Ambulatory Visit: Payer: No Typology Code available for payment source | Admitting: Podiatry

## 2020-07-18 ENCOUNTER — Other Ambulatory Visit: Payer: Self-pay | Admitting: Family Medicine

## 2020-07-24 ENCOUNTER — Other Ambulatory Visit: Payer: Self-pay | Admitting: Family Medicine

## 2020-07-25 ENCOUNTER — Encounter: Payer: Self-pay | Admitting: Internal Medicine

## 2020-07-31 ENCOUNTER — Ambulatory Visit (INDEPENDENT_AMBULATORY_CARE_PROVIDER_SITE_OTHER): Payer: No Typology Code available for payment source | Admitting: Family Medicine

## 2020-07-31 ENCOUNTER — Encounter: Payer: Self-pay | Admitting: Family Medicine

## 2020-07-31 ENCOUNTER — Other Ambulatory Visit: Payer: Self-pay

## 2020-07-31 VITALS — BP 133/80 | HR 69 | Temp 97.8°F | Resp 16 | Ht 66.5 in | Wt 186.8 lb

## 2020-07-31 DIAGNOSIS — E782 Mixed hyperlipidemia: Secondary | ICD-10-CM

## 2020-07-31 DIAGNOSIS — E781 Pure hyperglyceridemia: Secondary | ICD-10-CM | POA: Diagnosis not present

## 2020-07-31 DIAGNOSIS — Z0000 Encounter for general adult medical examination without abnormal findings: Secondary | ICD-10-CM

## 2020-07-31 DIAGNOSIS — R7301 Impaired fasting glucose: Secondary | ICD-10-CM | POA: Diagnosis not present

## 2020-07-31 DIAGNOSIS — Z1211 Encounter for screening for malignant neoplasm of colon: Secondary | ICD-10-CM

## 2020-07-31 LAB — COMPREHENSIVE METABOLIC PANEL
ALT: 23 U/L (ref 0–53)
AST: 21 U/L (ref 0–37)
Albumin: 4.5 g/dL (ref 3.5–5.2)
Alkaline Phosphatase: 39 U/L (ref 39–117)
BUN: 18 mg/dL (ref 6–23)
CO2: 26 mEq/L (ref 19–32)
Calcium: 9.3 mg/dL (ref 8.4–10.5)
Chloride: 102 mEq/L (ref 96–112)
Creatinine, Ser: 1.06 mg/dL (ref 0.40–1.50)
GFR: 84.82 mL/min (ref >60.00)
Glucose, Bld: 104 mg/dL — ABNORMAL HIGH (ref 70–99)
Potassium: 5.4 mEq/L — ABNORMAL HIGH (ref 3.5–5.1)
Sodium: 138 mEq/L (ref 135–145)
Total Bilirubin: 0.5 mg/dL (ref 0.2–1.2)
Total Protein: 6.9 g/dL (ref 6.0–8.3)

## 2020-07-31 LAB — LIPID PANEL
Cholesterol: 326 mg/dL — ABNORMAL HIGH (ref 0–200)
HDL: 37.4 mg/dL — ABNORMAL LOW (ref >39.00)
Total CHOL/HDL Ratio: 9
Triglycerides: 741 mg/dL — ABNORMAL HIGH (ref 0.0–149.0)

## 2020-07-31 LAB — CBC WITH DIFFERENTIAL/PLATELET
Basophils Absolute: 0.1 10*3/uL (ref 0.0–0.1)
Basophils Relative: 2 % (ref 0.0–3.0)
Eosinophils Absolute: 0.2 10*3/uL (ref 0.0–0.7)
Eosinophils Relative: 3.5 % (ref 0.0–5.0)
HCT: 42.8 % (ref 39.0–52.0)
Hemoglobin: 14.5 g/dL (ref 13.0–17.0)
Lymphocytes Relative: 40.9 % (ref 12.0–46.0)
Lymphs Abs: 2.3 10*3/uL (ref 0.7–4.0)
MCHC: 33.8 g/dL (ref 30.0–36.0)
MCV: 90.6 fl (ref 78.0–100.0)
Monocytes Absolute: 0.6 10*3/uL (ref 0.1–1.0)
Monocytes Relative: 10.5 % (ref 3.0–12.0)
Neutro Abs: 2.4 10*3/uL (ref 1.4–7.7)
Neutrophils Relative %: 43.1 % (ref 43.0–77.0)
Platelets: 184 10*3/uL (ref 150.0–400.0)
RBC: 4.72 Mil/uL (ref 4.22–5.81)
RDW: 14.5 % (ref 11.5–15.5)
WBC: 5.6 10*3/uL (ref 4.0–10.5)

## 2020-07-31 LAB — HEMOGLOBIN A1C: Hgb A1c MFr Bld: 6 % (ref 4.6–6.5)

## 2020-07-31 LAB — TSH: TSH: 2.71 u[IU]/mL (ref 0.35–4.50)

## 2020-07-31 LAB — LDL CHOLESTEROL, DIRECT: Direct LDL: 145 mg/dL

## 2020-07-31 NOTE — Patient Instructions (Signed)
Health Maintenance, Male Adopting a healthy lifestyle and getting preventive care are important in promoting health and wellness. Ask your health care provider about: The right schedule for you to have regular tests and exams. Things you can do on your own to prevent diseases and keep yourself healthy. What should I know about diet, weight, and exercise? Eat a healthy diet  Eat a diet that includes plenty of vegetables, fruits, low-fat dairy products, and lean protein. Do not eat a lot of foods that are high in solid fats, added sugars, or sodium.  Maintain a healthy weight Body mass index (BMI) is a measurement that can be used to identify possible weight problems. It estimates body fat based on height and weight. Your health care provider can help determine your BMI and help you achieve or maintain ahealthy weight. Get regular exercise Get regular exercise. This is one of the most important things you can do for your health. Most adults should: Exercise for at least 150 minutes each week. The exercise should increase your heart rate and make you sweat (moderate-intensity exercise). Do strengthening exercises at least twice a week. This is in addition to the moderate-intensity exercise. Spend less time sitting. Even light physical activity can be beneficial. Watch cholesterol and blood lipids Have your blood tested for lipids and cholesterol at 45 years of age, then havethis test every 5 years. You may need to have your cholesterol levels checked more often if: Your lipid or cholesterol levels are high. You are older than 45 years of age. You are at high risk for heart disease. What should I know about cancer screening? Many types of cancers can be detected early and may often be prevented. Depending on your health history and family history, you may need to have cancer screening at various ages. This may include screening for: Colorectal cancer. Prostate cancer. Skin cancer. Lung  cancer. What should I know about heart disease, diabetes, and high blood pressure? Blood pressure and heart disease High blood pressure causes heart disease and increases the risk of stroke. This is more likely to develop in people who have high blood pressure readings, are of African descent, or are overweight. Talk with your health care provider about your target blood pressure readings. Have your blood pressure checked: Every 3-5 years if you are 18-39 years of age. Every year if you are 40 years old or older. If you are between the ages of 65 and 75 and are a current or former smoker, ask your health care provider if you should have a one-time screening for abdominal aortic aneurysm (AAA). Diabetes Have regular diabetes screenings. This checks your fasting blood sugar level. Have the screening done: Once every three years after age 45 if you are at a normal weight and have a low risk for diabetes. More often and at a younger age if you are overweight or have a high risk for diabetes. What should I know about preventing infection? Hepatitis B If you have a higher risk for hepatitis B, you should be screened for this virus. Talk with your health care provider to find out if you are at risk forhepatitis B infection. Hepatitis C Blood testing is recommended for: Everyone born from 1945 through 1965. Anyone with known risk factors for hepatitis C. Sexually transmitted infections (STIs) You should be screened each year for STIs, including gonorrhea and chlamydia, if: You are sexually active and are younger than 45 years of age. You are older than 45 years of age   and your health care provider tells you that you are at risk for this type of infection. Your sexual activity has changed since you were last screened, and you are at increased risk for chlamydia or gonorrhea. Ask your health care provider if you are at risk. Ask your health care provider about whether you are at high risk for HIV.  Your health care provider may recommend a prescription medicine to help prevent HIV infection. If you choose to take medicine to prevent HIV, you should first get tested for HIV. You should then be tested every 3 months for as long as you are taking the medicine. Follow these instructions at home: Lifestyle Do not use any products that contain nicotine or tobacco, such as cigarettes, e-cigarettes, and chewing tobacco. If you need help quitting, ask your health care provider. Do not use street drugs. Do not share needles. Ask your health care provider for help if you need support or information about quitting drugs. Alcohol use Do not drink alcohol if your health care provider tells you not to drink. If you drink alcohol: Limit how much you have to 0-2 drinks a day. Be aware of how much alcohol is in your drink. In the [place] one drink equals one 12 oz bottle of beer (355 mL), one 5 oz glass of wine (148 mL), or one 1 oz glass of hard liquor (44 mL). General instructions Schedule regular health, dental, and eye exams. Stay current with your vaccines. Tell your health care provider if: You often feel depressed. You have ever been abused or do not feel safe at home. Summary Adopting a healthy lifestyle and getting preventive care are important in promoting health and wellness. Follow your health care provider's instructions about healthy diet, exercising, and getting tested or screened for diseases. Follow your health care provider's instructions on monitoring your cholesterol and blood pressure. This information is not intended to replace advice given to you by your health care provider. Make sure you discuss any questions you have with your healthcare provider. Document Revised: 01/14/2018 Document Reviewed: 01/14/2018 Elsevier Patient Education  2022 Elsevier Inc.  

## 2020-07-31 NOTE — Progress Notes (Signed)
Office Note 07/31/2020  CC:  Chief Complaint  Patient presents with   Annual Exam    Pt is fasting    HPI:  Alex Stevens is a 45 y.o. White male who is here for annual health maintenance exam. Doing well.   Working out 3-4 d/week typically. Diet still not very good regarding carbs and fats.  At advanced lipid clinic eval on 07/05/20 pt was put back on Atorvastatin, his gemfibrozil was changed to Fenofibrate 145 qd, and he was continued on vascepa. He is trying to get into a trial study with Dr. Lois Huxley was told to wait on restart of atorva while Dr. Debara Pickett is trying to get him into the trial.   Past Medical History:  Diagnosis Date   Acute pancreatitis    ? alc?   Severe hypertriglyceridemia (required plasmaphoresis 2017)   Alcohol abuse    question of->pt denies but records from admission for pancreatitis 2017 state + "alcohol dependency".   Chewing tobacco nicotine dependence    Hepatic steatosis    LFTs up in past   History of vitamin D deficiency    Hyperlipemia, mixed    Hypertriglyceridemia    severe. +pancreatitis in the past   IFG (impaired fasting glucose)    very mild 2017    History reviewed. No pertinent surgical history.  Family History  Problem Relation Age of Onset   Hyperlipidemia Mother    Hyperlipidemia Father    Heart disease Paternal Grandmother     Social History   Socioeconomic History   Marital status: Married    Spouse name: Not on file   Number of children: Not on file   Years of education: Not on file   Highest education level: Not on file  Occupational History   Not on file  Tobacco Use   Smoking status: Never   Smokeless tobacco: Current    Types: Chew  Substance and Sexual Activity   Alcohol use: Yes   Drug use: No   Sexual activity: Not on file  Other Topics Concern   Not on file  Social History Narrative   Married, 3 daughters, 2 sons.   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   NO  tob.   Alcohol->2-3 beers per week.   Denies hx of problem with alcohol, never used drugs.   Social Determinants of Health   Financial Resource Strain: Not on file  Food Insecurity: Not on file  Transportation Needs: Not on file  Physical Activity: Not on file  Stress: Not on file  Social Connections: Not on file  Intimate Partner Violence: Not on file    Outpatient Medications Prior to Visit  Medication Sig Dispense Refill   fenofibrate (TRICOR) 145 MG tablet Take 1 tablet (145 mg total) by mouth daily. 90 tablet 3   icosapent Ethyl (VASCEPA) 1 g capsule TAKE 2 CAPSULES BY MOUTH 2 TIMES DAILY. 120 capsule 0   atorvastatin (LIPITOR) 40 MG tablet Take 1 tablet (40 mg total) by mouth daily. (Patient not taking: Reported on 07/31/2020) 90 tablet 3   gemfibrozil (LOPID) 600 MG tablet TAKE 1 TABLET (600 MG TOTAL) BY MOUTH 2 (TWO) TIMES DAILY BEFORE A MEAL. (Patient not taking: Reported on 07/31/2020) 60 tablet 0   No facility-administered medications prior to visit.    No Known Allergies  ROS Review of Systems  Constitutional:  Negative for appetite change, chills, fatigue and fever.  HENT:  Negative for congestion, dental problem, ear  pain and sore throat.   Eyes:  Negative for discharge, redness and visual disturbance.  Respiratory:  Negative for cough, chest tightness, shortness of breath and wheezing.   Cardiovascular:  Negative for chest pain, palpitations and leg swelling.  Gastrointestinal:  Negative for abdominal pain, blood in stool, diarrhea, nausea and vomiting.  Genitourinary:  Negative for difficulty urinating, dysuria, flank pain, frequency, hematuria and urgency.  Musculoskeletal:  Negative for arthralgias, back pain, joint swelling, myalgias and neck stiffness.  Skin:  Negative for pallor and rash.  Neurological:  Negative for dizziness, speech difficulty, weakness and headaches.  Hematological:  Negative for adenopathy. Does not bruise/bleed easily.   Psychiatric/Behavioral:  Negative for confusion and sleep disturbance. The patient is not nervous/anxious.    PE; Vitals with BMI 07/31/2020 07/05/2020 01/27/2020  Height 5' 6.5" 5\' 7"  5\' 7"   Weight 186 lbs 13 oz 186 lbs 13 oz 194 lbs 3 oz  BMI 29.7 75.91 63.84  Systolic 665 993 570  Diastolic 80 86 74  Pulse 69 74 70   Gen: Alert, well appearing.  Patient is oriented to person, place, time, and situation. AFFECT: pleasant, lucid thought and speech. ENT: Ears: EACs clear, normal epithelium.  TMs with good light reflex and landmarks bilaterally.  Eyes: no injection, icteris, swelling, or exudate.  EOMI, PERRLA. Nose: no drainage or turbinate edema/swelling.  No injection or focal lesion.  Mouth: lips without lesion/swelling.  Oral mucosa pink and moist.  Dentition intact and without obvious caries or gingival swelling.  Oropharynx without erythema, exudate, or swelling.  Neck: supple/nontender.  No LAD, mass, or TM.  Carotid pulses 2+ bilaterally, without bruits. CV: RRR, no m/r/g.   LUNGS: CTA bilat, nonlabored resps, good aeration in all lung fields. ABD: soft, NT, ND, BS normal.  No hepatospenomegaly or mass.  No bruits. EXT: no clubbing, cyanosis, or edema.  Musculoskeletal: no joint swelling, erythema, warmth, or tenderness.  ROM of all joints intact. Skin - no sores or suspicious lesions or rashes or color changes  Pertinent labs:  Lab Results  Component Value Date   TSH 2.16 12/15/2018   Lab Results  Component Value Date   WBC 4.5 12/15/2018   HGB 15.1 12/15/2018   HCT 45.8 12/15/2018   MCV 92.7 12/15/2018   PLT 170.0 12/15/2018   Lab Results  Component Value Date   CREATININE 1.04 12/15/2018   BUN 11 12/15/2018   NA 137 12/15/2018   K 4.4 12/15/2018   CL 101 12/15/2018   CO2 28 12/15/2018   Lab Results  Component Value Date   ALT 43 03/09/2020   AST 23 03/09/2020   ALKPHOS 47 03/09/2020   BILITOT 0.6 03/09/2020   Lab Results  Component Value Date   CHOL 316  (H) 03/09/2020   Lab Results  Component Value Date   HDL 31.30 (L) 03/09/2020   Lab Results  Component Value Date   LDLCALC 107 05/05/2015   Lab Results  Component Value Date   TRIG (H) 03/09/2020    813.0 Triglyceride is over 400; calculations on Lipids are invalid.   Lab Results  Component Value Date   CHOLHDL 10 03/09/2020   Lab Results  Component Value Date   HGBA1C 5.6 12/15/2018   ASSESSMENT AND PLAN:   Health maintenance exam: Reviewed age and gender appropriate health maintenance issues (prudent diet, regular exercise, health risks of tobacco and excessive alcohol, use of seatbelts, fire alarms in home, use of sunscreen).  Also reviewed age and gender appropriate  health screening as well as vaccine recommendations. Vaccines: all UTD. Labs: fasting HP labs ordered. Prostate ca screening: average risk patient= as per latest guidelines, start screening at 63 yrs of age. Colon ca screening: average risk patient= as per latest guidelines, start screening at any time now-->referred to GI today for colonoscopy.  Hyperlipidemia, mixed: as per Dr. Lauraine Rinne lipid clinic. Will forward today's lab results to him.  An After Visit Summary was printed and given to the patient.  FOLLOW UP:  Return in about 1 year (around 07/31/2021) for annual CPE (fasting).  Signed:  Crissie Sickles, MD           07/31/2020

## 2020-08-02 ENCOUNTER — Encounter: Payer: Self-pay | Admitting: Family Medicine

## 2020-08-02 NOTE — Progress Notes (Signed)
Noted. EMR updated.

## 2020-08-10 NOTE — Telephone Encounter (Signed)
Attempted to call patient about genetic test results as MyChart message has no been reviewed. The voicemail is full

## 2020-08-16 ENCOUNTER — Telehealth: Payer: Self-pay | Admitting: *Deleted

## 2020-08-16 ENCOUNTER — Encounter: Payer: Self-pay | Admitting: Family Medicine

## 2020-08-16 NOTE — Telephone Encounter (Signed)
Left a voice mail for Alex Stevens about the CORE research study that we're doing with Dr. Debara Pickett. Left number for him to call us back to see if he's interested in coming in for a screening visit to see if we can enroll him.

## 2020-08-17 ENCOUNTER — Other Ambulatory Visit: Payer: Self-pay | Admitting: Family Medicine

## 2020-08-21 NOTE — Telephone Encounter (Signed)
Left message for patient that genetic test results are available in MyChart for his review.

## 2020-08-22 ENCOUNTER — Other Ambulatory Visit: Payer: Self-pay | Admitting: Family Medicine

## 2020-10-31 ENCOUNTER — Ambulatory Visit (HOSPITAL_BASED_OUTPATIENT_CLINIC_OR_DEPARTMENT_OTHER): Payer: No Typology Code available for payment source | Admitting: Internal Medicine

## 2020-12-26 ENCOUNTER — Telehealth: Payer: Self-pay | Admitting: Internal Medicine

## 2020-12-26 DIAGNOSIS — E783 Hyperchylomicronemia: Secondary | ICD-10-CM

## 2020-12-26 NOTE — Telephone Encounter (Signed)
Lipid panel and direct LDL ordered MyChart message sent to patient about completing labs before his 01/03/21 visit

## 2021-01-03 ENCOUNTER — Ambulatory Visit (HOSPITAL_BASED_OUTPATIENT_CLINIC_OR_DEPARTMENT_OTHER): Payer: No Typology Code available for payment source | Admitting: Internal Medicine

## 2021-04-17 ENCOUNTER — Ambulatory Visit (HOSPITAL_BASED_OUTPATIENT_CLINIC_OR_DEPARTMENT_OTHER): Payer: No Typology Code available for payment source | Admitting: Internal Medicine

## 2021-05-20 ENCOUNTER — Other Ambulatory Visit: Payer: Self-pay | Admitting: Family Medicine

## 2021-07-18 ENCOUNTER — Other Ambulatory Visit: Payer: Self-pay | Admitting: Family Medicine

## 2021-08-09 ENCOUNTER — Other Ambulatory Visit: Payer: Self-pay | Admitting: Internal Medicine

## 2021-09-16 ENCOUNTER — Other Ambulatory Visit: Payer: Self-pay | Admitting: Internal Medicine

## 2021-09-21 ENCOUNTER — Encounter (HOSPITAL_BASED_OUTPATIENT_CLINIC_OR_DEPARTMENT_OTHER): Payer: Self-pay | Admitting: Internal Medicine

## 2021-09-21 ENCOUNTER — Ambulatory Visit (INDEPENDENT_AMBULATORY_CARE_PROVIDER_SITE_OTHER): Payer: No Typology Code available for payment source | Admitting: Internal Medicine

## 2021-09-21 VITALS — BP 120/88 | HR 78 | Ht 67.0 in | Wt 202.7 lb

## 2021-09-21 DIAGNOSIS — E783 Hyperchylomicronemia: Secondary | ICD-10-CM

## 2021-09-21 DIAGNOSIS — Z8719 Personal history of other diseases of the digestive system: Secondary | ICD-10-CM | POA: Diagnosis not present

## 2021-09-21 DIAGNOSIS — E7849 Other hyperlipidemia: Secondary | ICD-10-CM | POA: Diagnosis not present

## 2021-09-21 NOTE — Progress Notes (Addendum)
LIPID CLINIC CONSULT NOTE  Chief Complaint:  Follow-up dyslipidemia  Primary Care Physician: Tammi Sou, MD  Primary Cardiologist:  None  HPI:  Alex Stevens is a 46 y.o. male who is being seen today for the evaluation of lipidemia at the request of McGowen, Adrian Blackwater, MD.  This is a pleasant 46 year old male who is kindly referred for evaluation management of dyslipidemia.  He has a history of high triglycerides in the past and has had pancreatitis.  He thought it was related to high triglycerides and apparently had plasmapheresis at [facility].  He also had history of alcohol use in the past however says that that has improved.  Other medical problems include impaired fasting glucose and hepatic steatosis.  There is a history of dyslipidemia in both parents,, but namely his father who also had high triglycerides.  Most recent labs indicated total cholesterol 316, triglycerides 813, HDL 31 and a direct LDL was measured at 117.  Liver enzymes were normal.  09/21/2021  Alex Stevens returns today for follow-up.  He has been compliant with medications but had to reschedule several appointments and did not get his blood work prior to this visit.  Overall he says he feels well denying any chest pain or shortness of breath.  Blood pressure appears well controlled today.  Weight still is an issue with a BMI of 31.  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  PMHx:  Past Medical History:  Diagnosis Date   Acute pancreatitis    ? alc?   Severe hypertriglyceridemia (required plasmaphoresis 2017)   Alcohol abuse    question of->pt denies but records from admission for pancreatitis 2017 state + "alcohol dependency".   Chewing tobacco nicotine dependence    Hepatic steatosis    LFTs up in past    History of vitamin D deficiency    Hyperlipemia, mixed    genetic testing 2022 at lipid specialist->he has "familial combined hypercholesterolemia"->pt will get into CORE trial (Apo C3 inhib) and possibly get on PCSK9-I.   Hypertriglyceridemia    severe. +pancreatitis in the past   Prediabetes 07/2020   Hba1c 6% June 2022    No past surgical history on file.  FAMHx:  Family History  Problem Relation Age of Onset   Hyperlipidemia Mother    Hyperlipidemia Father    Heart disease Paternal Grandmother     SOCHx:   reports that he has never smoked. His smokeless tobacco use includes chew. He reports current alcohol use. He reports that he does not use drugs.  ALLERGIES:  No Known Allergies  ROS: Pertinent items noted in HPI and remainder of comprehensive ROS otherwise negative.  HOME MEDS: Current Outpatient Medications on File Prior to Visit  Medication Sig Dispense Refill   atorvastatin (LIPITOR) 40 MG tablet TAKE 1 TABLET BY MOUTH EVERY DAY 90 tablet 3   fenofibrate (TRICOR) 145 MG tablet TAKE 1 TABLET BY MOUTH EVERY DAY 90 tablet 0   icosapent Ethyl (VASCEPA) 1 g capsule TAKE 2 CAPSULES BY MOUTH TWICE A DAY 360 capsule 1   No current facility-administered medications on file prior to visit.    LABS/IMAGING: No results found for this or any previous visit (from the past 48 hour(s)). No results found.  LIPID PANEL:    Component Value Date/Time   CHOL 326 (  H) 07/31/2020 1007   TRIG (H) 07/31/2020 0832    741.0 Triglyceride is over 400; calculations on Lipids are invalid.   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   Alex Stevens has a genetic triglyceride disorder and will likely need additional therapies if his triglycerides remain elevated.  He reports compliance with high intensity atorvastatin, fenofibrate and Vascepa.  We will plan fasting lab work today including a lipid NMR, direct LDL and LP(a).  I will adjust his medicines accordingly based on that.  Follow-up in 6 months or sooner as necessary.  [name], MD, [facility], [place] Director of the Advanced Lipid Disorders &  Cardiovascular Risk Reduction Clinic Diplomate of the American Board of Clinical Lipidology Attending Cardiologist  Direct Dial: [phone]  Fax: [phone]  Website:  [email or web address] 09/21/2021, 8:02 AM

## 2021-09-21 NOTE — Patient Instructions (Signed)
Medication Instructions:  NO CHANGES -- will depend on lab results   *If you need a refill on your cardiac medications before your next appointment, please call your pharmacy*   Lab Work: Lipid Panel, Direct LDL, LPa today   FASTING labs before next visit in 6 months   If you have labs (blood work) drawn today and your tests are completely normal, you will receive your results only by: [place] (if you have MyChart) OR A paper copy in the mail If you have any lab test that is abnormal or we need to change your treatment, we will call you to review the results.   Testing/Procedures: NONE   Follow-Up: At [facility], you and your health needs are our priority.  As part of our continuing mission to provide you with exceptional heart care, we have created designated Provider Care Teams.  These Care Teams include your primary Cardiologist (physician) and Advanced Practice Providers (APPs -  Physician Assistants and Nurse Practitioners) who all work together to provide you with the care you need, when you need it.  We recommend signing up for the patient portal called "MyChart".  Sign up information is provided on this After Visit Summary.  MyChart is used to connect with patients for Virtual Visits (Telemedicine).  Patients are able to view lab/test results, encounter notes, upcoming appointments, etc.  Non-urgent messages can be sent to your provider as well.   To learn more about what you can do with MyChart, go to [email or web address].    Your next appointment:   6 month(s)  The format for your next appointment:   In Person  Provider:   Lyman Bishop MD

## 2021-09-22 LAB — LIPOPROTEIN A (LPA): Lipoprotein (a): <8.4 nmol/L (ref <75.0)

## 2021-09-22 LAB — LIPID PANEL
Chol/HDL Ratio: 6.5 ratio — ABNORMAL HIGH (ref 0.0–5.0)
Cholesterol, Total: 221 mg/dL — ABNORMAL HIGH (ref 100–199)
HDL: 34 mg/dL — ABNORMAL LOW (ref >39)
LDL Chol Calc (NIH): 123 mg/dL — ABNORMAL HIGH (ref 0–99)
Triglycerides: 364 mg/dL — ABNORMAL HIGH (ref 0–149)
VLDL Cholesterol Cal: 64 mg/dL — ABNORMAL HIGH (ref 5–40)

## 2021-09-22 LAB — LDL CHOLESTEROL, DIRECT: LDL Direct: 126 mg/dL — ABNORMAL HIGH (ref 0–99)

## 2021-09-25 ENCOUNTER — Other Ambulatory Visit: Payer: Self-pay | Admitting: *Deleted

## 2021-09-25 DIAGNOSIS — E783 Hyperchylomicronemia: Secondary | ICD-10-CM

## 2021-09-25 MED ORDER — EZETIMIBE 10 MG PO TABS: 10.0000 mg | ORAL_TABLET | Freq: Every day | ORAL | 3 refills | Status: DC

## 2021-10-28 ENCOUNTER — Other Ambulatory Visit: Payer: Self-pay | Admitting: Internal Medicine

## 2022-01-25 ENCOUNTER — Other Ambulatory Visit: Payer: Self-pay | Admitting: Family Medicine

## 2022-02-16 ENCOUNTER — Other Ambulatory Visit: Payer: Self-pay | Admitting: Family Medicine

## 2022-03-01 DIAGNOSIS — J302 Other seasonal allergic rhinitis: Secondary | ICD-10-CM | POA: Insufficient documentation

## 2022-03-01 DIAGNOSIS — R1319 Other dysphagia: Secondary | ICD-10-CM | POA: Insufficient documentation

## 2022-05-07 ENCOUNTER — Encounter: Payer: No Typology Code available for payment source | Admitting: Family Medicine

## 2022-05-23 ENCOUNTER — Telehealth: Payer: Self-pay | Admitting: Pharmacy Technician

## 2022-05-23 NOTE — Patient Instructions (Signed)
It was very nice to see you today!   PLEASE NOTE:   If you had any lab tests please let us know if you have not heard back within a few days. You may see your results on MyChart before we have a chance to review them but we will give you a call once they are reviewed by us. If we ordered any referrals today, please let us know if you have not heard from their office within the next 2 weeks. You should receive a letter via MyChart confirming if the referral was approved and their office contact information to schedule. Health Maintenance, Male Adopting a healthy lifestyle and getting preventive care are important in promoting health and wellness. Ask your health care provider about: The right schedule for you to have regular tests and exams. Things you can do on your own to prevent diseases and keep yourself healthy. What should I know about diet, weight, and exercise? Eat a healthy diet  Eat a diet that includes plenty of vegetables, fruits, low-fat dairy products, and lean protein. Do not eat a lot of foods that are high in solid fats, added sugars, or sodium. Maintain a healthy weight Body mass index (BMI) is a measurement that can be used to identify possible weight problems. It estimates body fat based on height and weight. Your health care provider can help determine your BMI and help you achieve or maintain a healthy weight. Get regular exercise Get regular exercise. This is one of the most important things you can do for your health. Most adults should: Exercise for at least 150 minutes each week. The exercise should increase your heart rate and make you sweat (moderate-intensity exercise). Do strengthening exercises at least twice a week. This is in addition to the moderate-intensity exercise. Spend less time sitting. Even light physical activity can be beneficial. Watch cholesterol and blood lipids Have your blood tested for lipids and cholesterol at 47 years of age, then have this  test every 5 years. You may need to have your cholesterol levels checked more often if: Your lipid or cholesterol levels are high. You are older than 47 years of age. You are at high risk for heart disease. What should I know about cancer screening? Many types of cancers can be detected early and may often be prevented. Depending on your health history and family history, you may need to have cancer screening at various ages. This may include screening for: Colorectal cancer. Prostate cancer. Skin cancer. Lung cancer. What should I know about heart disease, diabetes, and high blood pressure? Blood pressure and heart disease High blood pressure causes heart disease and increases the risk of stroke. This is more likely to develop in people who have high blood pressure readings or are overweight. Talk with your health care provider about your target blood pressure readings. Have your blood pressure checked: Every 3-5 years if you are 18-39 years of age. Every year if you are 40 years old or older. If you are between the ages of 65 and 75 and are a current or former smoker, ask your health care provider if you should have a one-time screening for abdominal aortic aneurysm (AAA). Diabetes Have regular diabetes screenings. This checks your fasting blood sugar level. Have the screening done: Once every three years after age 45 if you are at a normal weight and have a low risk for diabetes. More often and at a younger age if you are overweight or have a high risk   for diabetes. What should I know about preventing infection? Hepatitis B If you have a higher risk for hepatitis B, you should be screened for this virus. Talk with your health care provider to find out if you are at risk for hepatitis B infection. Hepatitis C Blood testing is recommended for: Everyone born from 1945 through 1965. Anyone with known risk factors for hepatitis C. Sexually transmitted infections (STIs) You should be  screened each year for STIs, including gonorrhea and chlamydia, if: You are sexually active and are younger than 47 years of age. You are older than 47 years of age and your health care provider tells you that you are at risk for this type of infection. Your sexual activity has changed since you were last screened, and you are at increased risk for chlamydia or gonorrhea. Ask your health care provider if you are at risk. Ask your health care provider about whether you are at high risk for HIV. Your health care provider may recommend a prescription medicine to help prevent HIV infection. If you choose to take medicine to prevent HIV, you should first get tested for HIV. You should then be tested every 3 months for as long as you are taking the medicine. Follow these instructions at home: Alcohol use Do not drink alcohol if your health care provider tells you not to drink. If you drink alcohol: Limit how much you have to 0-2 drinks a day. Know how much alcohol is in your drink. In the [place] one drink equals one 12 oz bottle of beer (355 mL), one 5 oz glass of wine (148 mL), or one 1 oz glass of hard liquor (44 mL). Lifestyle Do not use any products that contain nicotine or tobacco. These products include cigarettes, chewing tobacco, and vaping devices, such as e-cigarettes. If you need help quitting, ask your health care provider. Do not use street drugs. Do not share needles. Ask your health care provider for help if you need support or information about quitting drugs. General instructions Schedule regular health, dental, and eye exams. Stay current with your vaccines. Tell your health care provider if: You often feel depressed. You have ever been abused or do not feel safe at home. Summary Adopting a healthy lifestyle and getting preventive care are important in promoting health and wellness. Follow your health care provider's instructions about healthy diet, exercising, and getting tested  or screened for diseases. Follow your health care provider's instructions on monitoring your cholesterol and blood pressure. This information is not intended to replace advice given to you by your health care provider. Make sure you discuss any questions you have with your health care provider. Document Revised: 06/12/2020 Document Reviewed: 06/12/2020 Elsevier Patient Education  2023 Elsevier Inc.  

## 2022-05-23 NOTE — Telephone Encounter (Signed)
Patient Advocate Encounter  Prior Authorization for Vascepa 1GM capsules has been approved.    PA# [id number] Key: [id number]  Insurance [employer] Electronic PA Form Effective dates: 05/23/2022 through 05/23/2023

## 2022-06-06 ENCOUNTER — Ambulatory Visit (INDEPENDENT_AMBULATORY_CARE_PROVIDER_SITE_OTHER): Payer: No Typology Code available for payment source | Admitting: Family Medicine

## 2022-06-06 ENCOUNTER — Encounter: Payer: Self-pay | Admitting: Family Medicine

## 2022-06-06 VITALS — BP 130/80 | HR 74 | Ht 67.0 in | Wt 198.0 lb

## 2022-06-06 DIAGNOSIS — R7301 Impaired fasting glucose: Secondary | ICD-10-CM

## 2022-06-06 DIAGNOSIS — E782 Mixed hyperlipidemia: Secondary | ICD-10-CM

## 2022-06-06 DIAGNOSIS — Z1283 Encounter for screening for malignant neoplasm of skin: Secondary | ICD-10-CM

## 2022-06-06 DIAGNOSIS — Z0000 Encounter for general adult medical examination without abnormal findings: Secondary | ICD-10-CM | POA: Diagnosis not present

## 2022-06-06 DIAGNOSIS — Z1211 Encounter for screening for malignant neoplasm of colon: Secondary | ICD-10-CM | POA: Diagnosis not present

## 2022-06-06 LAB — CBC WITH DIFFERENTIAL/PLATELET
Basophils Absolute: 0.1 10*3/uL (ref 0.0–0.1)
Basophils Relative: 2 % (ref 0.0–3.0)
Eosinophils Absolute: 0.1 10*3/uL (ref 0.0–0.7)
Eosinophils Relative: 3.1 % (ref 0.0–5.0)
HCT: 42.4 % (ref 39.0–52.0)
Hemoglobin: 14.3 g/dL (ref 13.0–17.0)
Lymphocytes Relative: 38.1 % (ref 12.0–46.0)
Lymphs Abs: 1.5 10*3/uL (ref 0.7–4.0)
MCHC: 33.6 g/dL (ref 30.0–36.0)
MCV: 91.1 fl (ref 78.0–100.0)
Monocytes Absolute: 0.4 10*3/uL (ref 0.1–1.0)
Monocytes Relative: 10.6 % (ref 3.0–12.0)
Neutro Abs: 1.9 10*3/uL (ref 1.4–7.7)
Neutrophils Relative %: 46.2 % (ref 43.0–77.0)
Platelets: 178 10*3/uL (ref 150.0–400.0)
RBC: 4.66 Mil/uL (ref 4.22–5.81)
RDW: 14.4 % (ref 11.5–15.5)
WBC: 4 10*3/uL (ref 4.0–10.5)

## 2022-06-06 LAB — LIPID PANEL
Cholesterol: 154 mg/dL (ref 0–200)
HDL: 34.4 mg/dL — ABNORMAL LOW (ref >39.00)
NonHDL: 119.29
Total CHOL/HDL Ratio: 4
Triglycerides: 310 mg/dL — ABNORMAL HIGH (ref 0.0–149.0)
VLDL: 62 mg/dL — ABNORMAL HIGH (ref 0.0–40.0)

## 2022-06-06 LAB — COMPREHENSIVE METABOLIC PANEL
ALT: 25 U/L (ref 0–53)
AST: 20 U/L (ref 0–37)
Albumin: 4.6 g/dL (ref 3.5–5.2)
Alkaline Phosphatase: 49 U/L (ref 39–117)
BUN: 15 mg/dL (ref 6–23)
CO2: 26 mEq/L (ref 19–32)
Calcium: 9.4 mg/dL (ref 8.4–10.5)
Chloride: 106 mEq/L (ref 96–112)
Creatinine, Ser: 1.15 mg/dL (ref 0.40–1.50)
GFR: 75.92 mL/min (ref >60.00)
Glucose, Bld: 106 mg/dL — ABNORMAL HIGH (ref 70–99)
Potassium: 4.8 mEq/L (ref 3.5–5.1)
Sodium: 142 mEq/L (ref 135–145)
Total Bilirubin: 0.6 mg/dL (ref 0.2–1.2)
Total Protein: 6.9 g/dL (ref 6.0–8.3)

## 2022-06-06 LAB — TSH: TSH: 2.85 u[IU]/mL (ref 0.35–5.50)

## 2022-06-06 LAB — HEMOGLOBIN A1C: Hgb A1c MFr Bld: 6 % (ref 4.6–6.5)

## 2022-06-06 LAB — LDL CHOLESTEROL, DIRECT: Direct LDL: 78 mg/dL

## 2022-06-06 NOTE — Progress Notes (Signed)
Office Note 06/06/2022  CC:  Chief Complaint  Patient presents with   Annual Exam    Fasting CPE. Wants to see if he could do Cologuard.    HPI:  Patient is a 47 y.o. male who is here for annual health maintenance exam.  Hard bump on back of neck noted by hairdresser 1 mo ago.  No pain or itching. Needs new derm ref b/c old one is gone.  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  Does not get to work out as much as he wants.  Past Medical History:  Diagnosis Date   Acute pancreatitis    ? alc?   Severe hypertriglyceridemia (required plasmaphoresis 2017)   Alcohol abuse    question of->pt denies but records from admission for pancreatitis 2017 state + "alcohol dependency".   Chewing tobacco nicotine dependence    Hepatic steatosis    LFTs up in past   History of vitamin D deficiency    Hyperlipemia, mixed    genetic testing 2022 at lipid specialist->he has "familial combined hypercholesterolemia"->pt will get into CORE trial (Apo C3 inhib) and possibly get on PCSK9-I.   Hypertriglyceridemia    severe. +pancreatitis in the past   Prediabetes 07/2020   Hba1c 6% June 2022    History reviewed. No pertinent surgical history.  Family History  Problem Relation Age of Onset   Hyperlipidemia Mother    Hyperlipidemia Father    Heart disease Paternal Grandmother     Social History   Socioeconomic History   Marital status: Married    Spouse name: Not on file   Number of children: Not on file   Years of education: Not on file   Highest education level: Not on file  Occupational History   Not on file  Tobacco Use   Smoking status: Never   Smokeless tobacco: Current    Types: Chew  Substance and Sexual Activity   Alcohol use: Yes   Drug use: No   Sexual activity: Not on file  Other Topics Concern   Not on file  Social History Narrative   Married, 3 daughters, 2 sons.   Educ: BS college   Occup: Holiday representative (Museum/gallery conservator  in [place]   NO tob.   Alcohol->2-3 beers per week.   Denies hx of problem with alcohol, never used drugs.   Social Determinants of Health   Financial Resource Strain: Not on file  Food Insecurity: Not on file  Transportation Needs: Not on file  Physical Activity: Not on file  Stress: Not on file  Social Connections: Not on file  Intimate Partner Violence: Not on file    Outpatient Medications Prior to Visit  Medication Sig Dispense Refill   atorvastatin (LIPITOR) 40 MG tablet TAKE 1 TABLET BY MOUTH EVERY DAY 90 tablet 3   ezetimibe (ZETIA) 10 MG tablet Take 1 tablet (10 mg total) by mouth daily. 90 tablet 3   fenofibrate (TRICOR) 145 MG tablet TAKE 1 TABLET BY MOUTH EVERY DAY 90 tablet 3   fluticasone (FLONASE) 50 MCG/ACT nasal spray Place 1 spray into both nostrils 2 (two) times daily.     icosapent Ethyl (VASCEPA) 1 g capsule TAKE 2 CAPSULES BY MOUTH TWICE A DAY 360 capsule 1   omeprazole (PRILOSEC) 20 MG capsule Take 20 mg by mouth 2 (two) times daily before a meal.     No facility-administered medications prior to visit.    No Known Allergies  Review of Systems  PE;    06/06/2022    9:02 AM 09/21/2021    7:51 AM 07/31/2020    8:10 AM  Vitals with BMI  Height 5\' 7"  5\' 7"  5' 6.5"  Weight 198 lbs 202 lbs 11 oz 186 lbs 13 oz  BMI 31 31.74 29.7  Systolic 137 120 161  Diastolic 85 88 80  Pulse 74 78 69     Gen: Alert, well appearing.  Patient is oriented to person, place, time, and situation. AFFECT: pleasant, lucid thought and speech. ENT: Ears: EACs clear, normal epithelium.  TMs with good light reflex and landmarks bilaterally.  Eyes: no injection, icteris, swelling, or exudate.  EOMI, PERRLA. Nose: no drainage or turbinate edema/swelling.  No injection or focal lesion.  Mouth: lips without lesion/swelling.  Oral mucosa pink and moist.  Dentition intact and without obvious caries or gingival swelling.  Oropharynx without erythema, exudate, or swelling.  Neck:  supple/nontender.  No LAD, mass, or TM.  Carotid pulses 2+ bilaterally, without bruits. CV: RRR, no m/r/g.   LUNGS: CTA bilat, nonlabored resps, good aeration in all lung fields. ABD: soft, NT, ND, BS normal.  No hepatospenomegaly or mass.  No bruits. EXT: no clubbing, cyanosis, or edema.  Musculoskeletal: no joint swelling, erythema, warmth, or tenderness.  ROM of all joints intact. Skin -back of the neck with a 2 cm subcu mobile nodule that is soft and nontender.  No overlying induration or rash.  No sores or suspicious lesions or rashes or color changes  Pertinent labs:  Lab Results  Component Value Date   TSH 2.71 07/31/2020   Lab Results  Component Value Date   WBC 5.6 07/31/2020   HGB 14.5 07/31/2020   HCT 42.8 07/31/2020   MCV 90.6 07/31/2020   PLT 184.0 07/31/2020   Lab Results  Component Value Date   CREATININE 1.06 07/31/2020   BUN 18 07/31/2020   NA 138 07/31/2020   K 5.4 (H) 07/31/2020   CL 102 07/31/2020   CO2 26 07/31/2020   Lab Results  Component Value Date   ALT 23 07/31/2020   AST 21 07/31/2020   ALKPHOS 39 07/31/2020   BILITOT 0.5 07/31/2020   Lab Results  Component Value Date   CHOL 221 (H) 09/21/2021   Lab Results  Component Value Date   HDL 34 (L) 09/21/2021   Lab Results  Component Value Date   LDLCALC 123 (H) 09/21/2021   Lab Results  Component Value Date   TRIG 364 (H) 09/21/2021   Lab Results  Component Value Date   CHOLHDL 6.5 (H) 09/21/2021   Lab Results  Component Value Date   HGBA1C 6.0 07/31/2020    ASSESSMENT AND PLAN:   #1 health maintenance exam: Reviewed age and gender appropriate health maintenance issues (prudent diet, regular exercise, health risks of tobacco and excessive alcohol, use of seatbelts, fire alarms in home, use of sunscreen).  Also reviewed age and gender appropriate health screening as well as vaccine recommendations. Vaccines: all UTD. Labs: fasting HP labs + Hba1c (prediabetes) ordered. Prostate  ca screening: average risk patient= as per latest guidelines, start screening at 31 yrs of age. Colon ca screening: average risk patient= as per latest guidelines, start screening at any time now-->cologuard ordered.  #2 epidermal inclusion cyst on the back of his neck.  Reassured.  Signs/symptoms to call or return for were reviewed and pt expressed understanding.  #3 family history of skin cancer.  Patient had a dermatologist  that he got yearly screenings with but that dermatologist is no longer practicing so he request referral to a new dermatologist--ordered today.  #4 mixed hyperlipidemia.  Followed by Dr. Rennis Golden. He is on atorvastatin 40 mg a day, Zetia 10 mg a day, and fenofibrate 145 mg a day.  Checking lipid panel today and will forward these results to Dr. Rennis Golden to make him aware.  An After Visit Summary was printed and given to the patient.  FOLLOW UP:  No follow-ups on file.  Signed:  Santiago Bumpers, MD           06/06/2022

## 2022-09-03 ENCOUNTER — Other Ambulatory Visit: Payer: Self-pay | Admitting: Internal Medicine

## 2022-10-31 ENCOUNTER — Telehealth: Payer: Self-pay | Admitting: Internal Medicine

## 2022-10-31 DIAGNOSIS — E7849 Other hyperlipidemia: Secondary | ICD-10-CM

## 2022-10-31 DIAGNOSIS — E783 Hyperchylomicronemia: Secondary | ICD-10-CM

## 2022-10-31 NOTE — Telephone Encounter (Signed)
Lipid panel and dLDL ordered  Patient aware to complete fasting lab work at least a few days before his 10/9 visit at any [facility]

## 2022-10-31 NOTE — Telephone Encounter (Signed)
Patient has lipid f/u appt on 10/9, and needs to have labs done prior to his visit. His lab orders have expired if the orders for lipid panel and LDL cholesterol, direct can be reentered.  Thank you.

## 2022-11-09 LAB — LIPID PANEL
Chol/HDL Ratio: 9 {ratio} — ABNORMAL HIGH (ref 0.0–5.0)
Cholesterol, Total: 208 mg/dL — ABNORMAL HIGH (ref 100–199)
HDL: 23 mg/dL — ABNORMAL LOW (ref >39)
Triglycerides: 1033 mg/dL (ref 0–149)

## 2022-11-09 LAB — LDL CHOLESTEROL, DIRECT: LDL Direct: 70 mg/dL (ref 0–99)

## 2022-11-13 ENCOUNTER — Encounter (HOSPITAL_BASED_OUTPATIENT_CLINIC_OR_DEPARTMENT_OTHER): Payer: Self-pay | Admitting: Nurse Practitioner

## 2022-11-13 ENCOUNTER — Ambulatory Visit (INDEPENDENT_AMBULATORY_CARE_PROVIDER_SITE_OTHER): Payer: No Typology Code available for payment source | Admitting: Nurse Practitioner

## 2022-11-13 VITALS — BP 118/74 | HR 83 | Ht 67.0 in | Wt 198.6 lb

## 2022-11-13 DIAGNOSIS — E783 Hyperchylomicronemia: Secondary | ICD-10-CM | POA: Diagnosis not present

## 2022-11-13 DIAGNOSIS — E785 Hyperlipidemia, unspecified: Secondary | ICD-10-CM

## 2022-11-13 DIAGNOSIS — Z7189 Other specified counseling: Secondary | ICD-10-CM

## 2022-11-13 MED ORDER — ICOSAPENT ETHYL 1 G PO CAPS: 2.0000 g | ORAL_CAPSULE | Freq: Two times a day (BID) | ORAL | 3 refills | Status: DC

## 2022-11-13 NOTE — Patient Instructions (Signed)
Medication Instructions:   START Vascepa two (2) tablet by mouth ( 2g) twice daily.   *If you need a refill on your cardiac medications before your next appointment, please call your pharmacy*   Lab Work:  The week prior to your next appointment in January fasting after midnight.   If you have labs (blood work) drawn today and your tests are completely normal, you will receive your results only by: MyChart Message (if you have MyChart) OR A paper copy in the mail If you have any lab test that is abnormal or we need to change your treatment, we will call you to review the results.   Testing/Procedures:  Eligha Bridegroom, NP  has ordered a CT coronary calcium score.   Test locations:   [facility]   This is $99 out of pocket.   Coronary CalciumScan A coronary calcium scan is an imaging test used to look for deposits of calcium and other fatty materials (plaques) in the inner lining of the blood vessels of the heart (coronary arteries). These deposits of calcium and plaques can partly clog and narrow the coronary arteries without producing any symptoms or warning signs. This puts a person at risk for a heart attack. This test can detect these deposits before symptoms develop. Tell a health care provider about: Any allergies you have. All medicines you are taking, including vitamins, herbs, eye drops, creams, and over-the-counter medicines. Any problems you or family members have had with anesthetic medicines. Any blood disorders you have. Any surgeries you have had. Any medical conditions you have. Whether you are pregnant or may be pregnant. What are the risks? Generally, this is a safe procedure. However, problems may occur, including: Harm to a pregnant woman and her unborn baby. This test involves the use of radiation. Radiation exposure can be dangerous to a pregnant woman and her unborn baby. If you are pregnant, you generally should not have this procedure  done. Slight increase in the risk of cancer. This is because of the radiation involved in the test. What happens before the procedure? No preparation is needed for this procedure. What happens during the procedure? You will undress and remove any jewelry around your neck or chest. You will put on a hospital gown. Sticky electrodes will be placed on your chest. The electrodes will be connected to an electrocardiogram (ECG) machine to record a tracing of the electrical activity of your heart. A CT scanner will take pictures of your heart. During this time, you will be asked to lie still and hold your breath for 2-3 seconds while a picture of your heart is being taken. The procedure may vary among health care providers and hospitals. What happens after the procedure? You can get dressed. You can return to your normal activities. It is up to you to get the results of your test. Ask your health care provider, or the department that is doing the test, when your results will be ready. Summary A coronary calcium scan is an imaging test used to look for deposits of calcium and other fatty materials (plaques) in the inner lining of the blood vessels of the heart (coronary arteries). Generally, this is a safe procedure. Tell your health care provider if you are pregnant or may be pregnant. No preparation is needed for this procedure. A CT scanner will take pictures of your heart. You can return to your normal activities after the scan is done. This information is not intended to replace advice given to you  by your health care provider. Make sure you discuss any questions you have with your health care provider. Document Released: 07/20/2007 Document Revised: 12/11/2015 Document Reviewed: 12/11/2015 Elsevier Interactive Patient Education  2017 [employer].    Follow-Up: At [facility], you and your health needs are our priority.  As part of our continuing mission to provide you with  exceptional heart care, we have created designated Provider Care Teams.  These Care Teams include your primary Cardiologist (physician) and Advanced Practice Providers (APPs -  Physician Assistants and Nurse Practitioners) who all work together to provide you with the care you need, when you need it.  We recommend signing up for the patient portal called "MyChart".  Sign up information is provided on this After Visit Summary.  MyChart is used to connect with patients for Virtual Visits (Telemedicine).  Patients are able to view lab/test results, encounter notes, upcoming appointments, etc.  Non-urgent messages can be sent to your provider as well.   To learn more about what you can do with MyChart, go to [email or web address].    Your next appointment:   3 month(s)  Provider:   Eligha Bridegroom, NP    Other Instructions  Adopting a Healthy Lifestyle.   Weight: Know what a healthy weight is for you (roughly BMI <25) and aim to maintain this. You can calculate your body mass index on your smart phone. Unfortunately, this is not the most accurate measure of healthy weight, but it is the simplest measurement to use. A more accurate measurement involves body scanning which measures lean muscle, fat tissue and bony density. We do not have this equipment at [facility].    Diet: Aim for 7+ servings of fruits and vegetables daily Limit animal fats in diet for cholesterol and heart health - choose grass fed whenever available Avoid highly processed foods (fast food burgers, tacos, fried chicken, pizza, hot dogs, french fries)  Saturated fat comes in the form of butter, lard, coconut oil, margarine, partially hydrogenated oils, and fat in meat. These increase your risk of cardiovascular disease.  Use healthy plant oils, such as olive, canola, soy, corn, sunflower and peanut.  Whole foods such as fruits, vegetables and whole grains have fiber  Men need > 38 grams of fiber per day Women need > 25 grams  of fiber per day  Load up on vegetables and fruits - one-half of your plate: Aim for color and variety, and remember that potatoes dont count. Go for whole grains - one-quarter of your plate: Whole wheat, barley, wheat berries, quinoa, oats, brown rice, and foods made with them. If you want pasta, go with whole wheat pasta. Protein power - one-quarter of your plate: Fish, chicken, beans, and nuts are all healthy, versatile protein sources. Limit red meat. You need carbohydrates for energy! The type of carbohydrate is more important than the amount. Choose carbohydrates such as vegetables, fruits, whole grains, beans, and nuts in the place of white rice, white pasta, potatoes (baked or fried), macaroni and cheese, cakes, cookies, and donuts.  If youre thirsty, drink water. Coffee and tea are good in moderation, but skip sugary drinks and limit milk and dairy products to one or two daily servings. Keep sugar intake at 6 teaspoons or 24 grams or LESS       Exercise: Aim for 150 min of moderate intensity exercise weekly for heart health, and weights twice weekly for bone health Stay active - any steps are better than no steps! Aim for 7-9  hours of sleep daily

## 2022-11-13 NOTE — Progress Notes (Signed)
Cardiology Office Note:  .   Date:  11/13/2022  ID:  Alex Stevens, DOB July 30, 1975, MRN [id number] PCP: Jeoffrey Massed, MD  [facility] Providers Cardiologist:  None    Patient Profile: .      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 Rennis Golden 07/05/2020.  He has a history of high triglycerides and has had pancreatitis.  He thought it was related to high triglycerides and apparently had plasmapheresis at [facility].  He also had history of alcohol use in the past but reports that has improved.  He also has history of impaired fasting glucose and hepatic steatosis.  History of dyslipidemia in both parents, but namely his father has had high triglycerides.  At time of referral total cholesterol 316, triglycerides 813, HDL 31, and direct LDL 117.  Liver enzymes were normal.  Unfortunately his statin had been inadvertently discontinued.  He was encouraged to continue Vascepa 2 g twice daily in addition to fenofibrate 145 mg daily and resume atorvastatin 40 mg daily.  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  There was discussion about clinical trials however he ultimately did not enroll.  Lab results completed 09/21/2021 revealed LP(a) not elevated.  Triglycerides significantly improved at 364, LDL 123, HDL 34, total cholesterol 865.  He was advised to add ezetimibe 10 mg daily for LDL goal less than 70.       History of Present Illness: .   Alex Stevens is a very pleasant 47 y.o. male who is here today for follow-up of hypertriglyceridemia. Most recent lab results reveal triglycerides 1033, LDL direct 70, total cholesterol 208. Recent dramatic increase in triglycerides despite consistent adherence to their medication regimen, including  Lipitor, Zetia, and fenofibrate. He acknowledged a lack of exercise and poor diet, particularly during the football season, which includes increased alcohol and saturated fat intake. He does not eat sweets. Vascepa was previously prescribed but he is no longer taking this.  He is unsure as to how or when Vascepa was discontinued.  He denies any liver abnormalities or instructions from their previous doctor to discontinue the fish oil. No recent symptoms of pancreatitis.  He denies chest pain, shortness of breath, orthopnea, PND, palpitations, edema, presyncope, syncope.  ROS: See HPI       Studies Reviewed: .         Risk Assessment/Calculations:             Physical Exam:   VS:  BP 118/74   Pulse 83   Ht 5\' 7"  (1.702 m)   Wt 198 lb 9.6 oz (90.1 kg)   SpO2 95%   BMI 31.11 kg/m    Wt Readings from Last 3 Encounters:  11/13/22 198 lb 9.6 oz (90.1 kg)  06/06/22 198 lb (89.8 kg)  09/21/21 202 lb 11.2 oz (91.9 kg)    GEN: Well nourished, well developed in no acute distress NECK: No JVD; No carotid bruits CARDIAC: RRR, no murmurs, rubs, gallops RESPIRATORY:  Clear to auscultation without rales, wheezing or rhonchi  ABDOMEN: Soft, non-tender, non-distended EXTREMITIES:  No edema; No deformity     ASSESSMENT AND PLAN: .    Dyslipidemia LDL goal < 70/Hypertriglyceridemia: LDL 70, at goal. Noted significant increase in triglycerides despite adherence to current medication regimen (Lipitor, Zetia, Fenofibrate). Vascepa was previously well-tolerated, but was inadvertently discontinued. We  will restart Vascepa 2 grams twice daily. Discussed the impact of diet, exercise, and alcohol consumption on triglyceride levels. He has known genetic predisposition affecting triglyceride levels. Encouraged 10 minutes of walking after meals. Advised moderation of alcohol intake, particularly in combination with high fat meals. Consider over-the-counter fish oil if Vascepa is cost-prohibitive. Check  triglycerides in 3 months.  Cardiac risk assessment: We discussed the potential benefit of CT calcium score as a risk assessment for cardiovascular disease.  He is agreeable to get CT calcium score.        Dispo: 3 months with me  Signed, Eligha Bridegroom, NP-C

## 2022-11-21 ENCOUNTER — Telehealth: Payer: Self-pay | Admitting: Dermatology

## 2022-11-27 ENCOUNTER — Ambulatory Visit: Payer: No Typology Code available for payment source | Admitting: Dermatology

## 2022-12-01 ENCOUNTER — Other Ambulatory Visit: Payer: Self-pay | Admitting: Internal Medicine

## 2022-12-04 ENCOUNTER — Encounter: Payer: Self-pay | Admitting: Dermatology

## 2022-12-04 ENCOUNTER — Ambulatory Visit: Payer: No Typology Code available for payment source | Admitting: Dermatology

## 2022-12-04 VITALS — BP 126/83 | HR 71

## 2022-12-04 DIAGNOSIS — D492 Neoplasm of unspecified behavior of bone, soft tissue, and skin: Secondary | ICD-10-CM | POA: Diagnosis not present

## 2022-12-04 DIAGNOSIS — D485 Neoplasm of uncertain behavior of skin: Secondary | ICD-10-CM

## 2022-12-04 DIAGNOSIS — L814 Other melanin hyperpigmentation: Secondary | ICD-10-CM

## 2022-12-04 DIAGNOSIS — Z1283 Encounter for screening for malignant neoplasm of skin: Secondary | ICD-10-CM | POA: Diagnosis not present

## 2022-12-04 DIAGNOSIS — L821 Other seborrheic keratosis: Secondary | ICD-10-CM

## 2022-12-04 DIAGNOSIS — L820 Inflamed seborrheic keratosis: Secondary | ICD-10-CM | POA: Diagnosis not present

## 2022-12-04 DIAGNOSIS — W908XXA Exposure to other nonionizing radiation, initial encounter: Secondary | ICD-10-CM

## 2022-12-04 DIAGNOSIS — D1801 Hemangioma of skin and subcutaneous tissue: Secondary | ICD-10-CM

## 2022-12-04 DIAGNOSIS — L219 Seborrheic dermatitis, unspecified: Secondary | ICD-10-CM

## 2022-12-04 DIAGNOSIS — D2239 Melanocytic nevi of other parts of face: Secondary | ICD-10-CM

## 2022-12-04 DIAGNOSIS — Z808 Family history of malignant neoplasm of other organs or systems: Secondary | ICD-10-CM

## 2022-12-04 DIAGNOSIS — L578 Other skin changes due to chronic exposure to nonionizing radiation: Secondary | ICD-10-CM

## 2022-12-04 DIAGNOSIS — D229 Melanocytic nevi, unspecified: Secondary | ICD-10-CM

## 2022-12-04 NOTE — Patient Instructions (Addendum)
Patient Handout: Wound Care for Skin Biopsy Site  Taking Care of Your Skin Biopsy Site  Proper care of the biopsy site is essential for promoting healing and minimizing scarring. This handout provides instructions on how to care for your biopsy site to ensure optimal recovery.  1. Cleaning the Wound:  Clean the biopsy site daily with gentle soap and water. Gently pat the area dry with a clean, soft towel. Avoid harsh scrubbing or rubbing the area, as this can irritate the skin and delay healing.  2. Applying Aquaphor and Bandage:  After cleaning the wound, apply a thin layer of Aquaphor ointment to the biopsy site. Cover the area with a sterile bandage to protect it from dirt, bacteria, and friction. Change the bandage daily or as needed if it becomes soiled or wet.  3. Continued Care for One Week:  Repeat the cleaning, Aquaphor application, and bandaging process daily for one week following the biopsy procedure. Keeping the wound clean and moist during this initial healing period will help prevent infection and promote optimal healing.  4. Massaging Aquaphor into the Area:  ---After one week, discontinue the use of bandages but continue to apply Aquaphor to the biopsy site. ----Gently massage the Aquaphor into the area using circular motions. ---Massaging the skin helps to promote circulation and prevent the formation of scar tissue.   Additional Tips:  Avoid exposing the biopsy site to direct sunlight during the healing process, as this can cause hyperpigmentation or worsen scarring. If you experience any signs of infection, such as increased redness, swelling, warmth, or drainage from the wound, contact your healthcare provider immediately. Follow any additional instructions provided by your healthcare provider for caring for the biopsy site and managing any discomfort. Conclusion:  Taking proper care of your skin biopsy site is crucial for ensuring optimal healing and  minimizing scarring. By following these instructions for cleaning, applying Aquaphor, and massaging the area, you can promote a smooth and successful recovery. If you have any questions or concerns about caring for your biopsy site, don't hesitate to contact your healthcare provider for guidance.    Cryotherapy Aftercare  Wash gently with soap and water everyday.   Apply Vaseline and Band-Aid daily until healed.   Skin Education : We counseled the patient regarding the following: Sun screen (SPF 30 or greater) should be applied during peak UV exposure (between 10am and 2pm) and reapplied after exercise or swimming.  The ABCDEs of melanoma were reviewed with the patient, and the importance of monthly self-examination of moles was emphasized. Should any moles change in shape or color, or itch, bleed or burn, pt will contact our office for evaluation sooner then their interval appointment.  Plan: Sunscreen Recommendations We recommended a broad spectrum sunscreen with a SPF of 30 or higher. SPF 30 sunscreens block approximately 97 percent of the sun's harmful rays. Sunscreens should be applied at least 15 minutes prior to expected sun exposure and then every 2 hours after that as long as sun exposure continues. If swimming or exercising sunscreen should be reapplied every 45 minutes to an hour after getting wet or sweating. One ounce, or the equivalent of a shot glass full of sunscreen, is adequate to protect the skin not covered by a bathing suit. We also recommended a lip balm with a sunscreen as well. Sun protective clothing can be used in lieu of sunscreen but must be worn the entire time you are exposed to the sun's rays.   Important Information  Due to recent changes in healthcare laws, you may see results of your pathology and/or laboratory studies on MyChart before the doctors have had a chance to review them. We understand that in some cases there may be results that are confusing or  concerning to you. Please understand that not all results are received at the same time and often the doctors may need to interpret multiple results in order to provide you with the best plan of care or course of treatment. Therefore, we ask that you please give [place] 2 business days to thoroughly review all your results before contacting the office for clarification. Should we see a critical lab result, you will be contacted sooner.     If You Need Anything After Your Visit   If you have any questions or concerns for your doctor, please call our main line at [phone]. If no one answers, please leave a voicemail as directed and we will return your call as soon as possible. Messages left after 4 pm will be answered the following business day.    You may also send [place] a message via MyChart. We typically respond to MyChart messages within 1-2 business days.  For prescription refills, please ask your pharmacy to contact our office. Our fax number is [phone].  If you have an urgent issue when the clinic is closed that cannot wait until the next business day, you can page your doctor at the number below.     Please note that while we do our best to be available for urgent issues outside of office hours, we are not available 24/7.    If you have an urgent issue and are unable to reach [place], you may choose to seek medical care at your doctor's office, retail clinic, urgent care center, or emergency room.   If you have a medical emergency, please immediately call 911 or go to the emergency department. In the event of inclement weather, please call our main line at [phone] for an update on the status of any delays or closures.  Dermatology Medication Tips: Please keep the boxes that topical medications come in in order to help keep track of the instructions about where and how to use these. Pharmacies typically print the medication instructions only on the boxes and not directly on the medication  tubes.   If your medication is too expensive, please contact our office at [phone] or send [place] a message through MyChart.    We are unable to tell what your co-pay for medications will be in advance as this is different depending on your insurance coverage. However, we may be able to find a substitute medication at lower cost or fill out paperwork to get insurance to cover a needed medication.    If a prior authorization is required to get your medication covered by your insurance company, please allow [place] 1-2 business days to complete this process.   Drug prices often vary depending on where the prescription is filled and some pharmacies may offer cheaper prices.   The website www.goodrx.com contains coupons for medications through different pharmacies. The prices here do not account for what the cost may be with help from insurance (it may be cheaper with your insurance), but the website can give you the price if you did not use any insurance.  - You can print the associated coupon and take it with your prescription to the pharmacy.  - You may also stop by our office during regular business hours and  pick up a GoodRx coupon card.  - If you need your prescription sent electronically to a different pharmacy, notify our office through [facility] or by phone at [phone]

## 2022-12-04 NOTE — Progress Notes (Signed)
   New Patient Visit   Subjective  Alex Stevens is a 47 y.o. male who presents for the following: Skin Cancer Screening and Full Body Skin Exam  The patient presents for Total-Body Skin Exam (TBSE) for skin cancer screening and mole check. The patient has spots, moles and lesions to be evaluated, some may be new or changing. Pt has no hx of skin cancer but has family hx of NMSC  The following portions of the chart were reviewed this encounter and updated as appropriate: medications, allergies, medical history  Review of Systems:  No other skin or systemic complaints except as noted in HPI or Assessment and Plan.  Objective  Well appearing patient in no apparent distress; mood and affect are within normal limits.  A full examination was performed including scalp, head, eyes, ears, nose, lips, neck, chest, axillae, abdomen, back, buttocks, bilateral upper extremities, bilateral lower extremities, hands, feet, fingers, toes, fingernails, and toenails. All findings within normal limits unless otherwise noted below.   Relevant physical exam findings are noted in the Assessment and Plan.  Left Buccal Cheek Flesh colored papule         Assessment & Plan   SKIN CANCER SCREENING PERFORMED TODAY.  ACTINIC DAMAGE - Chronic condition, secondary to cumulative UV/sun exposure - diffuse scaly erythematous macules with underlying dyspigmentation - Recommend daily broad spectrum sunscreen SPF 30+ to sun-exposed areas, reapply every 2 hours as needed.  - Staying in the shade or wearing long sleeves, sun glasses (UVA+UVB protection) and wide brim hats (4-inch brim around the entire circumference of the hat) are also recommended for sun protection.  - Call for new or changing lesions.  MELANOCYTIC NEVI - Tan-brown and/or pink-flesh-colored symmetric macules and papules - Benign appearing on exam today - Observation - Call clinic for new or changing moles - Recommend daily use of broad  spectrum spf 30+ sunscreen to sun-exposed areas.   SEBORRHEIC KERATOSIS - Stuck-on, waxy, tan-brown papules and/or plaques  - Benign-appearing - Discussed benign etiology and prognosis. - Observe - Call for any changes  LENTIGINES Exam: scattered tan macules Due to sun exposure Treatment Plan: Benign-appearing, observe. Recommend daily broad spectrum sunscreen SPF 30+ to sun-exposed areas, reapply every 2 hours as needed.  Call for any changes    HEMANGIOMA Exam: red papule(s) Discussed benign nature. Recommend observation. Call for changes.   Inflamed seborrheic keratosis Right Malar Cheek  Destruction of lesion - Right Malar Cheek  Destruction method: cryotherapy   Timeout:  patient name, date of birth, surgical site, and procedure verified Lesion destroyed using liquid nitrogen: Yes   Region frozen until ice ball extended beyond lesion: Yes    Neoplasm of uncertain behavior of skin Left Buccal Cheek  Skin / nail biopsy Type of biopsy: tangential   Timeout: patient name, date of birth, surgical site, and procedure verified   Instrument used: DermaBlade    Specimen 1 - Surgical pathology Differential Diagnosis: R/O NMSC  Check Margins: No    Return in about 1 year (around 12/04/2023) for TBSE.  I, Tillie Fantasia, CMA, am acting as scribe for Gwenith Daily, MD.   Documentation: I have reviewed the above documentation for accuracy and completeness, and I agree with the above.  Gwenith Daily, MD

## 2022-12-06 LAB — SURGICAL PATHOLOGY

## 2022-12-09 ENCOUNTER — Inpatient Hospital Stay (HOSPITAL_BASED_OUTPATIENT_CLINIC_OR_DEPARTMENT_OTHER): Admission: RE | Admit: 2022-12-09 | Payer: No Typology Code available for payment source | Source: Ambulatory Visit

## 2022-12-23 ENCOUNTER — Ambulatory Visit (HOSPITAL_BASED_OUTPATIENT_CLINIC_OR_DEPARTMENT_OTHER)
Admission: RE | Admit: 2022-12-23 | Discharge: 2022-12-23 | Disposition: A | Discharge status: Home / Self Care | Payer: No Typology Code available for payment source | Source: Ambulatory Visit | Attending: Nurse Practitioner | Admitting: Nurse Practitioner

## 2022-12-23 DIAGNOSIS — E783 Hyperchylomicronemia: Secondary | ICD-10-CM | POA: Insufficient documentation

## 2023-02-12 ENCOUNTER — Encounter: Payer: Self-pay | Admitting: Family Medicine

## 2023-02-19 ENCOUNTER — Encounter (HOSPITAL_BASED_OUTPATIENT_CLINIC_OR_DEPARTMENT_OTHER): Payer: No Typology Code available for payment source | Admitting: Nurse Practitioner

## 2023-03-18 ENCOUNTER — Encounter (HOSPITAL_BASED_OUTPATIENT_CLINIC_OR_DEPARTMENT_OTHER): Payer: Self-pay

## 2023-03-18 LAB — NMR, LIPOPROFILE
Cholesterol, Total: 167 mg/dL (ref 100–199)
HDL Particle Number: 23.4 umol/L — ABNORMAL LOW (ref >=30.5)
HDL-C: 23 mg/dL — ABNORMAL LOW (ref >39)
LDL Particle Number: 788 nmol/L (ref <1000)
LDL Size: 19.5 nmol — ABNORMAL LOW (ref >20.5)
LDL-C (NIH Calc): 61 mg/dL (ref 0–99)
LP-IR Score: 67 — ABNORMAL HIGH (ref <=45)
Small LDL Particle Number: 661 nmol/L — ABNORMAL HIGH (ref <=527)
Triglycerides: 543 mg/dL — ABNORMAL HIGH (ref 0–149)

## 2023-03-18 LAB — HEPATIC FUNCTION PANEL
ALT: 26 [IU]/L (ref 0–44)
AST: 19 [IU]/L (ref 0–40)
Albumin: 4.5 g/dL (ref 4.1–5.1)
Alkaline Phosphatase: 56 [IU]/L (ref 44–121)
Bilirubin Total: <0.2 mg/dL (ref 0.0–1.2)
Bilirubin, Direct: 0.09 mg/dL (ref 0.00–0.40)
Total Protein: 6.8 g/dL (ref 6.0–8.5)

## 2023-03-18 NOTE — Progress Notes (Unsigned)
Cardiology Office Note:  .   Date:  03/19/2023  ID:  Alex Stevens, DOB 11-01-1975, MRN [id number] PCP: Alex Massed, MD  [facility] Providers Cardiologist:  None    Patient Profile: .      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 Rennis Stevens 07/05/2020.  He has a history of high triglycerides and has had pancreatitis.  He thought it was related to high triglycerides and apparently had plasmapheresis at [facility].  He also had history of alcohol use in the past but reports that has improved.  He also has history of impaired fasting glucose and hepatic steatosis.  History of dyslipidemia in both parents, but namely his father has had high triglycerides.  At time of referral total cholesterol 316, triglycerides 813, HDL 31, and direct LDL 117.  Liver enzymes were normal.  Unfortunately his statin had been inadvertently discontinued.  He was encouraged to continue Vascepa 2 g twice daily in addition to fenofibrate 145 mg daily and resume atorvastatin 40 mg daily.  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  There was discussion about clinical trials however he ultimately did not enroll.  Lab results completed 09/21/2021 revealed LP(a) not elevated.  Triglycerides significantly improved at 364, LDL 123, HDL 34, total cholesterol 562.  He was advised to add ezetimibe 10 mg daily for LDL goal less than 70.  Seen by me on 11/13/2022 for follow-up of hypertriglyceridemia. Most recent lab results reveal triglycerides 1033, LDL direct 70, total cholesterol 208. Recent dramatic increase in triglycerides despite consistent adherence to  medication regimen, including Lipitor, Zetia, and fenofibrate. He  acknowledged a lack of exercise and poor diet, particularly during the football season, which included increased alcohol and saturated fat intake. He does not eat sweets. Vascepa was previously prescribed but he is no longer taking this.  He is unsure as to how or when Vascepa was discontinued.  He denied any liver abnormalities or instructions from their previous doctor to discontinue the fish oil. No recent symptoms of pancreatitis.  He denied chest pain, shortness of breath, orthopnea, PND, palpitations, edema, presyncope, syncope. Vascepa 2 g BID was restarted with instructions to call if not affordable. CT calcium score was ordered for further risk stratification and revealed calcium score of 0. He has evidence of hepatic steatosis on CT.        History of Present Illness: .   Alex Stevens is a very pleasant 48 y.o. male who is here today for follow-up of dyslipidemia. He reports feeling much better since the last visit and attributes this improvement to medication, increased exercise, and improved diet. We discussed recent lab results in detail. Reviewed recent coronary calcium score of zero and preventative measures for ASCVD. He reports no symptoms of pancreatitis which was very painful and he hopes never recurs.  He denies chest pain, shortness of breath, orthopnea, PND, palpitations, presyncope, syncope.   ROS: See HPI       Studies Reviewed: Marland Kitchen   EKG Interpretation Date/Time:  Wednesday March 19 2023 15:36:14 EST Ventricular Rate:  73 PR Interval:  144 QRS Duration:  76 QT Interval:  356 QTC Calculation: 392 R Axis:   2  Text Interpretation: Normal sinus rhythm Nonspecific T wave abnormality Confirmed by Alex Stevens [id number]) on 03/19/2023 3:39:34 PM  Risk Assessment/Calculations:             Physical Exam:   VS:  BP 120/80 (BP Location: Left Arm, Patient Position: Sitting, Cuff Size: Normal)   Pulse 73   Ht 5\' 7"  (1.702 m)   Wt 201 lb (91.2 kg)   BMI 31.48 kg/m     Wt Readings from Last 3 Encounters:  03/19/23 201 lb (91.2 kg)  11/13/22 198 lb 9.6 oz (90.1 kg)  06/06/22 198 lb (89.8 kg)    GEN: Well nourished, well developed in no acute distress NECK: No JVD; No carotid bruits CARDIAC: RRR, no murmurs, rubs, gallops RESPIRATORY:  Clear to auscultation without rales, wheezing or rhonchi  ABDOMEN: Soft, non-tender, non-distended EXTREMITIES:  No edema; No deformity     ASSESSMENT AND PLAN: .    Dyslipidemia LDL goal < 70/Hyperchylomicronemia : NMR lipid panel completed 03/14/23 revealed LDL particle number 788, LDL-C 61, HDL-C 23, triglycerides 543, and small LDL particle number 661. Thankfully he has a coronary calcium score of 0.  He is tolerating Vascepa 4 grams daily, fenofibrate, ezetimibe, and atorvastatin without concerning side effects.  He reports he has increased exercise and is eating a healthier diet, although not perfect. Encouraged continued focus on primary prevention of ASCVD including heart healthy mostly plant based diet avoiding saturated fat, processed foods, simple carbohydrates, and sugar along with aiming for at least 150 minutes of moderate intensity exercise each week.  Continue Vascepa, fenofibrate, ezetimibe, and atorvastatin.  We will get repeat NMR and ALT in 6 months.  EKG: Baseline EKG obtained today which reveals NSR at 73 bpm, nonspecific ST abnormality.  No prior EKG for comparison.  He is asymptomatic.  Hepatic steatosis: Transaminases stable on labs completed 03/14/23. No symptoms of pancreatitis. Continue to limit saturated fat, alcohol and simple carbohydrates.         Dispo: 6 months with Dr. Rennis Stevens or me  Signed, Alex Bridegroom, NP-C

## 2023-03-19 ENCOUNTER — Encounter (HOSPITAL_BASED_OUTPATIENT_CLINIC_OR_DEPARTMENT_OTHER): Payer: Self-pay | Admitting: Nurse Practitioner

## 2023-03-19 ENCOUNTER — Ambulatory Visit (HOSPITAL_BASED_OUTPATIENT_CLINIC_OR_DEPARTMENT_OTHER): Payer: No Typology Code available for payment source | Admitting: Nurse Practitioner

## 2023-03-19 VITALS — BP 120/80 | HR 73 | Ht 67.0 in | Wt 201.0 lb

## 2023-03-19 DIAGNOSIS — E783 Hyperchylomicronemia: Secondary | ICD-10-CM

## 2023-03-19 DIAGNOSIS — K760 Fatty (change of) liver, not elsewhere classified: Secondary | ICD-10-CM

## 2023-03-19 DIAGNOSIS — R9431 Abnormal electrocardiogram [ECG] [EKG]: Secondary | ICD-10-CM

## 2023-03-19 DIAGNOSIS — Z8719 Personal history of other diseases of the digestive system: Secondary | ICD-10-CM

## 2023-03-19 DIAGNOSIS — E785 Hyperlipidemia, unspecified: Secondary | ICD-10-CM | POA: Diagnosis not present

## 2023-03-19 NOTE — Patient Instructions (Signed)
Medication Instructions:  Your physician recommends that you continue on your current medications as directed. Please refer to the Current Medication list given to you today.   Lab Work: Your physician recommends that you return for lab work in 6 months: NMR and ALT   Follow-Up: At [facility], you and your health needs are our priority.  As part of our continuing mission to provide you with exceptional heart care, we have created designated Provider Care Teams.  These Care Teams include your primary Cardiologist (physician) and Advanced Practice Providers (APPs -  Physician Assistants and Nurse Practitioners) who all work together to provide you with the care you need, when you need it.  We recommend signing up for the patient portal called "MyChart".  Sign up information is provided on this After Visit Summary.  MyChart is used to connect with patients for Virtual Visits (Telemedicine).  Patients are able to view lab/test results, encounter notes, upcoming appointments, etc.  Non-urgent messages can be sent to your provider as well.   To learn more about what you can do with MyChart, go to [email or web address].    Your next appointment:   6 month(s)  Provider:   Dr. Rennis Golden or Eligha Bridegroom, NP

## 2023-03-24 ENCOUNTER — Encounter: Payer: Self-pay | Admitting: Family Medicine

## 2023-03-24 ENCOUNTER — Ambulatory Visit: Payer: No Typology Code available for payment source | Admitting: Family Medicine

## 2023-03-24 VITALS — BP 124/76 | HR 72 | Temp 98.8°F | Ht 67.0 in | Wt 199.6 lb

## 2023-03-24 DIAGNOSIS — D367 Benign neoplasm of other specified sites: Secondary | ICD-10-CM | POA: Diagnosis not present

## 2023-03-24 NOTE — Progress Notes (Signed)
OFFICE VISIT  03/24/2023  CC:  Chief Complaint  Patient presents with   Neck Lump    Has been consistently been getting bigger for the past year; last week it started to ache.    Patient is a 48 y.o. male who presents for swelling on the back of neck.  HPI: Has had a small nodular swelling on the back of his neck that has been present around 1 year.  Recently it has grown some and at one point turned red and hurt.  The redness and pain are gone now but he does feel like it is still larger than before. It is annoying him a lot and he would like it removed.  Past Medical History:  Diagnosis Date   Acute pancreatitis    ? alc?   Severe hypertriglyceridemia (required plasmaphoresis 2017)   Alcohol abuse    question of->pt denies but records from admission for pancreatitis 2017 state + "alcohol dependency".   Chewing tobacco nicotine dependence    Hepatic steatosis    LFTs up in past   History of vitamin D deficiency    Hyperlipemia, mixed    genetic testing 2022 at lipid specialist->he has "familial combined hypercholesterolemia"->pt will get into CORE trial (Apo C3 inhib) and possibly get on PCSK9-I.   Hypertriglyceridemia    severe. +pancreatitis in the past   Prediabetes 07/2020   Hba1c 6% June 2022    History reviewed. No pertinent surgical history.  Outpatient Medications Prior to Visit  Medication Sig Dispense Refill   atorvastatin (LIPITOR) 40 MG tablet TAKE 1 TABLET BY MOUTH EVERY DAY 90 tablet 3   ezetimibe (ZETIA) 10 MG tablet TAKE 1 TABLET BY MOUTH EVERY DAY 90 tablet 3   fenofibrate (TRICOR) 145 MG tablet TAKE 1 TABLET BY MOUTH EVERY DAY 90 tablet 3   icosapent Ethyl (VASCEPA) 1 g capsule Take 2 capsules (2 g total) by mouth 2 (two) times daily. 360 capsule 3   No facility-administered medications prior to visit.    No Known Allergies  Review of Systems  As per HPI  PE:    03/24/2023    3:22 PM 03/19/2023    3:25 PM 12/04/2022   11:01 AM  Vitals with BMI   Height 5\' 7"  5\' 7"    Weight 199 lbs 10 oz 201 lbs   BMI 31.25 31.47   Systolic 124 120 098  Diastolic 76 80 83  Pulse 72 73 71     Physical Exam  Gen: Alert, well appearing.  Patient is oriented to person, place, time, and situation. AFFECT: pleasant, lucid thought and speech. L post neck with 2 cm round subcutaneous rubbery nodule that is mobile and nontender.  No fluctuance to suggest abscess.  LABS:  Last CBC Lab Results  Component Value Date   WBC 4.0 06/06/2022   HGB 14.3 06/06/2022   HCT 42.4 06/06/2022   MCV 91.1 06/06/2022   MCH 30.3 03/12/2015   RDW 14.4 06/06/2022   PLT 178.0 06/06/2022   Last metabolic panel Lab Results  Component Value Date   GLUCOSE 106 (H) 06/06/2022   NA 142 06/06/2022   K 4.8 06/06/2022   CL 106 06/06/2022   CO2 26 06/06/2022   BUN 15 06/06/2022   CREATININE 1.15 06/06/2022   GFR 75.92 06/06/2022   CALCIUM 9.4 06/06/2022   PROT 6.8 03/14/2023   ALBUMIN 4.5 03/14/2023   BILITOT <0.2 03/14/2023   ALKPHOS 56 03/14/2023   AST 19 03/14/2023   ALT 26  03/14/2023   ANIONGAP 8 03/12/2015   Lab Results  Component Value Date   HGBA1C 6.0 06/06/2022   IMPRESSION AND PLAN:  Enlarging dermoid cyst. No infection present. Patient would like this removed. Refer to general surgery for consideration of excision.  An After Visit Summary was printed and given to the patient.  FOLLOW UP: Return for as needed.  Signed:  Santiago Bumpers, MD           03/24/2023

## 2023-04-28 ENCOUNTER — Other Ambulatory Visit (HOSPITAL_COMMUNITY): Payer: Self-pay

## 2023-04-28 ENCOUNTER — Telehealth: Payer: Self-pay | Admitting: Pharmacy Technician

## 2023-04-28 NOTE — Telephone Encounter (Signed)
 Pharmacy Patient Advocate Encounter   Received notification from CoverMyMeds that prior authorization for VASCEPA is required/requested.   Insurance verification completed.   The patient is insured through [employer] .   Per test claim: Refill too soon. PA is not needed at this time. Medication was filled 03/01/23. Next eligible fill date is 05/19/23.   NOTE: Icosapent Ethyl 1 g Capsules (Vascepa) was filled

## 2023-04-28 NOTE — Telephone Encounter (Signed)
 noted

## 2023-08-31 ENCOUNTER — Other Ambulatory Visit: Payer: Self-pay | Admitting: Internal Medicine

## 2023-11-30 ENCOUNTER — Other Ambulatory Visit: Payer: Self-pay | Admitting: Internal Medicine

## 2023-12-02 ENCOUNTER — Encounter: Payer: Self-pay | Admitting: Family Medicine

## 2023-12-02 DIAGNOSIS — Z1211 Encounter for screening for malignant neoplasm of colon: Secondary | ICD-10-CM

## 2023-12-05 NOTE — Telephone Encounter (Signed)
 Please order GI referral for colon cancer screening

## 2023-12-09 ENCOUNTER — Ambulatory Visit: Admitting: Dermatology

## 2024-01-16 ENCOUNTER — Other Ambulatory Visit (HOSPITAL_BASED_OUTPATIENT_CLINIC_OR_DEPARTMENT_OTHER): Payer: Self-pay | Admitting: Nurse Practitioner

## 2024-01-19 ENCOUNTER — Telehealth: Payer: Self-pay | Admitting: Pharmacy Technician

## 2024-01-19 ENCOUNTER — Other Ambulatory Visit (HOSPITAL_COMMUNITY): Payer: Self-pay

## 2024-01-19 NOTE — Telephone Encounter (Signed)
° °  Pharmacy Patient Advocate Encounter  Received notification from [facility] that Prior Authorization for icosapent  has been APPROVED from 12/20/23 to 01/18/25   PA #/Case ID/Reference #: [id number]

## 2024-02-11 ENCOUNTER — Ambulatory Visit: Admitting: Dermatology

## 2024-03-04 ENCOUNTER — Encounter: Payer: Self-pay | Admitting: Pediatrics

## 2024-03-18 ENCOUNTER — Encounter

## 2024-04-01 ENCOUNTER — Encounter: Admitting: Pediatrics
